# Patient Record
Sex: Female | Born: 1995 | Race: Black or African American | Hispanic: No | Marital: Single | Smoking: Never smoker
Health system: Southern US, Community
[De-identification: ages and names within clinical notes are randomized; demographics above are authoritative.]

## PROBLEM LIST (undated history)

## (undated) ENCOUNTER — Inpatient Hospital Stay (HOSPITAL_COMMUNITY): Payer: Self-pay

## (undated) DIAGNOSIS — I10 Essential (primary) hypertension: Secondary | ICD-10-CM

## (undated) DIAGNOSIS — E669 Obesity, unspecified: Secondary | ICD-10-CM

## (undated) DIAGNOSIS — F32A Depression, unspecified: Secondary | ICD-10-CM

## (undated) DIAGNOSIS — Z8742 Personal history of other diseases of the female genital tract: Secondary | ICD-10-CM

## (undated) DIAGNOSIS — F329 Major depressive disorder, single episode, unspecified: Secondary | ICD-10-CM

## (undated) DIAGNOSIS — N10 Acute tubulo-interstitial nephritis: Secondary | ICD-10-CM

## (undated) DIAGNOSIS — F419 Anxiety disorder, unspecified: Secondary | ICD-10-CM

## (undated) DIAGNOSIS — R102 Pelvic and perineal pain: Secondary | ICD-10-CM

## (undated) DIAGNOSIS — O1490 Unspecified pre-eclampsia, unspecified trimester: Secondary | ICD-10-CM

## (undated) DIAGNOSIS — D573 Sickle-cell trait: Secondary | ICD-10-CM

## (undated) DIAGNOSIS — A6000 Herpesviral infection of urogenital system, unspecified: Secondary | ICD-10-CM

## (undated) DIAGNOSIS — N159 Renal tubulo-interstitial disease, unspecified: Secondary | ICD-10-CM

## (undated) DIAGNOSIS — A64 Unspecified sexually transmitted disease: Secondary | ICD-10-CM

## (undated) DIAGNOSIS — D649 Anemia, unspecified: Secondary | ICD-10-CM

## (undated) DIAGNOSIS — F8081 Childhood onset fluency disorder: Secondary | ICD-10-CM

## (undated) HISTORY — DX: Major depressive disorder, single episode, unspecified: F32.9

## (undated) HISTORY — DX: Acute pyelonephritis: N10

## (undated) HISTORY — DX: Depression, unspecified: F32.A

## (undated) HISTORY — DX: Essential (primary) hypertension: I10

## (undated) HISTORY — DX: Anxiety disorder, unspecified: F41.9

## (undated) HISTORY — DX: Herpesviral infection of urogenital system, unspecified: A60.00

## (undated) HISTORY — DX: Obesity, unspecified: E66.9

## (undated) HISTORY — DX: Pelvic and perineal pain: R10.2

## (undated) HISTORY — DX: Personal history of other diseases of the female genital tract: Z87.42

## (undated) HISTORY — DX: Anemia, unspecified: D64.9

## (undated) HISTORY — PX: OTHER SURGICAL HISTORY: SHX169

## (undated) HISTORY — DX: Sickle-cell trait: D57.3

---

## 2015-08-04 ENCOUNTER — Emergency Department (HOSPITAL_COMMUNITY)
Admission: EM | Admit: 2015-08-04 | Discharge: 2015-08-05 | Disposition: A | Discharge status: Home / Self Care | Payer: Medicaid Other | Attending: Emergency Medicine | Admitting: Emergency Medicine

## 2015-08-04 ENCOUNTER — Encounter (HOSPITAL_COMMUNITY): Payer: Self-pay | Admitting: Family Medicine

## 2015-08-04 DIAGNOSIS — N898 Other specified noninflammatory disorders of vagina: Secondary | ICD-10-CM | POA: Diagnosis present

## 2015-08-04 DIAGNOSIS — Z8619 Personal history of other infectious and parasitic diseases: Secondary | ICD-10-CM | POA: Diagnosis not present

## 2015-08-04 DIAGNOSIS — Z3202 Encounter for pregnancy test, result negative: Secondary | ICD-10-CM | POA: Diagnosis not present

## 2015-08-04 DIAGNOSIS — Z0000 Encounter for general adult medical examination without abnormal findings: Secondary | ICD-10-CM

## 2015-08-04 DIAGNOSIS — Z711 Person with feared health complaint in whom no diagnosis is made: Secondary | ICD-10-CM | POA: Diagnosis not present

## 2015-08-04 HISTORY — DX: Unspecified pre-eclampsia, unspecified trimester: O14.90

## 2015-08-04 HISTORY — DX: Unspecified sexually transmitted disease: A64

## 2015-08-04 NOTE — ED Provider Notes (Signed)
CSN: [id number]   Arrival date & time 08/04/15 2108  History  By signing my name below, I, Connie Shea, attest that this documentation has been prepared under the direction and in the presence of Gaelle Adriance, MD. Electronically Signed: Bethel BornBritney Shea, ED Scribe. 08/05/2015. 12:04 AM.  Chief Complaint  Patient presents with  . Vaginal Discharge    HPI Patient is a 19 y.o. female presenting with vaginal discharge. The history is provided by the patient. No language interpreter was used.  Vaginal Discharge Quality:  White, yellow and thick Duration:  1 month Timing:  Constant Progression:  Unchanged Chronicity:  New Context: after intercourse   Relieved by:  Nothing Worsened by:  Nothing tried Ineffective treatments:  None tried Associated symptoms: no abdominal pain, no fever, no nausea and no vomiting   Risk factors: STI exposure and unprotected sex    Connie Shea is a 19 y.o. female who presents to the Emergency Department complaining of vaginal discharge with onset earlier in the month. The discharge is thick and white/yellow. She notes that the discharge has recently changed in odor. She was treated  [place] [facility] for gonorrhea and has had additional sexual contact with the untreated partner that she suspects exposed her initially.  Pt denies vaginal bleeding and abdominal pain. LNMP was in August but she notes being chronically irregular secondary to her method of birth control.   Past Medical History  Diagnosis Date  . Sexually transmitted disease   . Pre-eclampsia     Past Surgical History  Procedure Laterality Date  . Cesarean section      History reviewed. No pertinent family history.  Social History  Substance Use Topics  . Smoking status: Never Smoker   . Smokeless tobacco: None  . Alcohol Use: No     Review of Systems  Constitutional: Negative for fever and chills.  Gastrointestinal: Negative for nausea, vomiting and abdominal pain.   Genitourinary: Positive for vaginal discharge.  Neurological: Negative for weakness.  All other systems reviewed and are negative.  Home Medications   Prior to Admission medications   Medication Sig Start Date End Date Taking? Authorizing Provider  etonogestrel (IMPLANON) 68 MG IMPL implant 1 each by Subdermal route once.    Historical Provider, MD    Allergies  Review of patient's allergies indicates no known allergies.  Triage Vitals: BP 147/93 mmHg  Pulse 99  Temp(Src) 98.7 F (37.1 C) (Oral)  Resp 18  Ht 5\' 4"  (1.626 m)  Wt 215 lb (97.523 kg)  BMI 36.89 kg/m2  SpO2 99%  LMP   Physical Exam  Constitutional: She is oriented to person, place, and time. She appears well-developed and well-nourished. No distress.  HENT:  Head: Normocephalic.  Mouth/Throat: Oropharynx is clear and moist.  Moist mucous membranes No exudate  Eyes: EOM are normal. Pupils are equal, round, and reactive to light.  Neck: Normal range of motion. Neck supple.  Trachea midline  Cardiovascular: Normal rate and regular rhythm.   Pulmonary/Chest: Effort normal and breath sounds normal. She has no wheezes. She has no rales.  CTAB.   Abdominal: Soft. Bowel sounds are normal. She exhibits no mass. There is no tenderness. There is no rebound and no guarding.  Genitourinary: Cervix exhibits no motion tenderness and no friability. Right adnexum displays no tenderness. Left adnexum displays no tenderness. Vaginal discharge found.  Chaperone present Scant white discharge No adnexal tenderness No CMT  Musculoskeletal: Normal range of motion.  Neurological: She is alert and  oriented to person, place, and time.  Skin: Skin is warm and dry. No rash noted.  No lesions  Psychiatric: She has a normal mood and affect. Her behavior is normal.  Nursing note and vitals reviewed.   ED Course  Procedures   DIAGNOSTIC STUDIES: Oxygen Saturation is 99% on RA, normal by my interpretation.    COORDINATION OF  CARE: 12:02 AM Discussed treatment plan which includes lab work and pelvic exam with pt at bedside and pt agreed to plan.  Labs Reviewed  WET PREP, GENITAL  POC URINE PREG, ED  GC/CHLAMYDIA PROBE AMP (Connie Shea) NOT AT [facility]    Imaging Review No results found.  I personally reviewed and evaluated these  lab results as a part of my medical decision-making.  MDM   Final diagnoses:  None    Scant white discharge, likely physiologic.  GC and chlamydia sent  I personally performed the services described in this documentation, which was scribed in my presence. The recorded information has been reviewed and is accurate.      Connie Bowker, MD 08/05/15 0100

## 2015-08-04 NOTE — ED Notes (Signed)
Pt is complaining of vaginal discharge without abd pain. Pt describes the discharge has white/yellow, thick, for about one month.

## 2015-08-05 ENCOUNTER — Encounter (HOSPITAL_COMMUNITY): Payer: Self-pay | Admitting: Emergency Medicine

## 2015-08-05 LAB — WET PREP, GENITAL
TRICH WET PREP: NONE SEEN
YEAST WET PREP: NONE SEEN

## 2015-08-05 LAB — POC URINE PREG, ED: PREG TEST UR: NEGATIVE

## 2015-08-05 LAB — GC/CHLAMYDIA PROBE AMP (~~LOC~~) NOT AT ARMC
CHLAMYDIA, DNA PROBE: POSITIVE — AB
Neisseria Gonorrhea: POSITIVE — AB

## 2015-08-06 ENCOUNTER — Telehealth (HOSPITAL_COMMUNITY): Payer: Self-pay

## 2015-08-06 NOTE — Telephone Encounter (Signed)
Positive for gonorrhea and chlamydia. Chart sent to EDP office for review.

## 2016-05-07 ENCOUNTER — Emergency Department (HOSPITAL_COMMUNITY): Payer: Self-pay

## 2016-05-07 ENCOUNTER — Emergency Department (HOSPITAL_COMMUNITY)
Admission: EM | Admit: 2016-05-07 | Discharge: 2016-05-07 | Disposition: A | Discharge status: Home / Self Care | Payer: Self-pay | Attending: Emergency Medicine | Admitting: Emergency Medicine

## 2016-05-07 ENCOUNTER — Encounter (HOSPITAL_COMMUNITY): Payer: Self-pay

## 2016-05-07 DIAGNOSIS — J9801 Acute bronchospasm: Secondary | ICD-10-CM | POA: Insufficient documentation

## 2016-05-07 DIAGNOSIS — J069 Acute upper respiratory infection, unspecified: Secondary | ICD-10-CM | POA: Insufficient documentation

## 2016-05-07 MED ORDER — AEROCHAMBER PLUS FLO-VU SMALL MISC
1.0000 | Freq: Once | Status: DC
  Filled 2016-05-07: qty 1

## 2016-05-07 MED ORDER — ALBUTEROL SULFATE HFA 108 (90 BASE) MCG/ACT IN AERS: 2.0000 | INHALATION_SPRAY | Freq: Once | RESPIRATORY_TRACT | Status: DC

## 2016-05-07 MED ORDER — DEXTROMETHORPHAN-GUAIFENESIN 15-400 MG PO TABS: 1.0000 | ORAL_TABLET | ORAL | 0 refills | Status: DC

## 2016-05-07 MED ORDER — PREDNISONE 20 MG PO TABS: 40.0000 mg | ORAL_TABLET | Freq: Every day | ORAL | 0 refills | Status: DC

## 2016-05-07 MED ORDER — AEROCHAMBER PLUS W/MASK MISC
1.0000 | Freq: Once | Status: DC
  Filled 2016-05-07: qty 1

## 2016-05-07 NOTE — ED Triage Notes (Signed)
She c/o uri sx and just "not feeling well" x 1 1 /2 weeks.

## 2016-05-07 NOTE — ED Provider Notes (Signed)
[facility]-EMERGENCY DEPT Provider Note   CSN: [id number] Arrival date & time: 05/07/16  1006  First Provider Contact:  First MD Initiated Contact with Patient 05/07/16 1119        History   Chief Complaint Chief Complaint  Patient presents with  . URI    HPI Connie Shea is a 20 y.o. female he presents with chief complaint of cough. She states that her symptoms began about a week and a half ago. She has associated nasal congestion. She complains that her cough has been keeping her up at night. She's been taking OTC medications without relief. She has a history of previous reactive airway disease with her URI symptoms. She has used nebulizer treatments previously. She denies wheezing. She has been feeling more fatigued. She denies other symptoms at this time.  HPI  Past Medical History:  Diagnosis Date  . Pre-eclampsia   . Sexually transmitted disease     There are no active problems to display for this patient.   Past Surgical History:  Procedure Laterality Date  . CESAREAN SECTION      OB History    No data available       Home Medications    Prior to Admission medications   Medication Sig Start Date End Date Taking? Authorizing Provider  etonogestrel (IMPLANON) 68 MG IMPL implant 1 each by Subdermal route once.    Historical Provider, MD    Family History No family history on file.  Social History Social History  Substance Use Topics  . Smoking status: Never Smoker  . Smokeless tobacco: Not on file  . Alcohol use No     Allergies   Review of patient's allergies indicates no known allergies.   Review of Systems Review of Systems  Ten systems reviewed and are negative for acute change, except as noted in the HPI.   Physical Exam Updated Vital Signs BP 142/90 (BP Location: Left Arm)   Pulse 90   Temp 98.5 F (36.9 C) (Oral)   Resp 16   LMP 05/07/2016   SpO2 98%   Physical Exam  Constitutional: She is oriented to person, place, and time. She  appears well-developed and well-nourished. No distress.  HENT:  Head: Normocephalic and atraumatic.  Eyes: Conjunctivae are normal. No scleral icterus.  Neck: Normal range of motion.  Cardiovascular: Normal rate, regular rhythm and normal heart sounds.  Exam reveals no gallop and no friction rub.   No murmur heard. Pulmonary/Chest: Effort normal and breath sounds normal. No respiratory distress.  Abdominal: Soft. Bowel sounds are normal. She exhibits no distension and no mass. There is no tenderness. There is no guarding.  Neurological: She is alert and oriented to person, place, and time.  Skin: Skin is warm and dry. She is not diaphoretic.  Nursing note and vitals reviewed.     ED Treatments / Results  Labs (all labs ordered are listed, but only abnormal results are displayed) Labs Reviewed - No data to display  EKG  EKG Interpretation None       Radiology Dg Chest 2 View  Result Date: 05/07/2016 CLINICAL DATA:  Congestion and cough. EXAM: CHEST  2 VIEW COMPARISON:  None. FINDINGS: The heart size and mediastinal contours are within normal limits. Both lungs are clear. The visualized skeletal structures are unremarkable. IMPRESSION: No active cardiopulmonary disease. Electronically Signed   By: Gerome Sam III M.D   On: 05/07/2016 11:41   Procedures Procedures (including critical care time)  Medications Ordered in ED  Medications - No data to display   Initial Impression / Assessment and Plan / ED Course  I have reviewed the triage vital signs and the nursing notes.  Pertinent labs & imaging results that were available during my care of the patient were reviewed by me and considered in my medical decision making (see chart for details).  Clinical Course    Pt CXR negative for acute infiltrate. Patients symptoms are consistent with URI, likely viral etiology. Discussed that antibiotics are not indicated for viral infections. Pt will be discharged with symptomatic  treatment.  Verbalizes understanding and is agreeable with plan. Pt is hemodynamically stable & in NAD prior to dc.   Final Clinical Impressions(s) / ED Diagnoses   Final diagnoses:  URI (upper respiratory infection)  Cough due to bronchospasm    New Prescriptions New Prescriptions   No medications on file     Arthor Captain, PA-C 05/07/16 1222    Shaune Pollack, MD 05/07/16 1818

## 2016-05-07 NOTE — ED Notes (Signed)
Patient reports she has had a cough for several weeks, sometimes productive.  Denies fever, sore throat, nausea, or vomiting.  Reports she sometimes uses her sister's nebulizer at home and their medicine but has not been prescribed anything.  History of bronchitis.

## 2016-06-17 ENCOUNTER — Emergency Department (HOSPITAL_COMMUNITY)
Admission: EM | Admit: 2016-06-17 | Discharge: 2016-06-17 | Disposition: A | Discharge status: Home / Self Care | Payer: Medicaid Other | Attending: Emergency Medicine | Admitting: Emergency Medicine

## 2016-06-17 ENCOUNTER — Encounter (HOSPITAL_COMMUNITY): Payer: Self-pay | Admitting: Emergency Medicine

## 2016-06-17 DIAGNOSIS — Z79899 Other long term (current) drug therapy: Secondary | ICD-10-CM | POA: Insufficient documentation

## 2016-06-17 DIAGNOSIS — J029 Acute pharyngitis, unspecified: Secondary | ICD-10-CM | POA: Insufficient documentation

## 2016-06-17 DIAGNOSIS — J358 Other chronic diseases of tonsils and adenoids: Secondary | ICD-10-CM | POA: Insufficient documentation

## 2016-06-17 LAB — RAPID STREP SCREEN (MED CTR MEBANE ONLY): STREPTOCOCCUS, GROUP A SCREEN (DIRECT): NEGATIVE

## 2016-06-17 NOTE — ED Provider Notes (Signed)
[facility]-EMERGENCY DEPT Provider Note   CSN: [id number] Arrival date & time: 06/17/16  1354  By signing my name below, I, Nelwyn SalisburyJoshua Fowler, attest that this documentation has been prepared under the direction and in the presence of non-physician practitioner, Harolyn RutherfordShawn Thressa Shiffer, PA-C. Electronically Signed: Nelwyn SalisburyJoshua Fowler, Scribe. 06/17/2016. 2:06 PM.  History   Chief Complaint Chief Complaint  Patient presents with  . Sore Throat   The history is provided by the patient. No language interpreter was used.     HPI Comments:  Connie Shea is a 20 y.o. female who presents to the Emergency Department complaining of sudden-onset constant, mild to moderate, bilateral sore throat over the past 2 days. Pt endorses associated swelling to the area and a nonproductive cough. She denies any recent fever/chills, N/V, difficulty swallowing or breathing, or any other complaints.      Past Medical History:  Diagnosis Date  . Pre-eclampsia   . Sexually transmitted disease     There are no active problems to display for this patient.   Past Surgical History:  Procedure Laterality Date  . CESAREAN SECTION      OB History    No data available       Home Medications    Prior to Admission medications   Medication Sig Start Date End Date Taking? Authorizing Provider  Dextromethorphan-Guaifenesin 15-400 MG TABS Take 1 tablet by mouth every 4 (four) hours. 05/07/16   Arthor CaptainAbigail Harris, PA-C  etonogestrel (IMPLANON) 68 MG IMPL implant 1 each by Subdermal route once.    Historical Provider, MD  predniSONE (DELTASONE) 20 MG tablet Take 2 tablets (40 mg total) by mouth daily. 05/07/16   Arthor CaptainAbigail Harris, PA-C    Family History No family history on file.  Social History Social History  Substance Use Topics  . Smoking status: Never Smoker  . Smokeless tobacco: Not on file  . Alcohol use No     Allergies   Review of patient's allergies indicates no known allergies.   Review of Systems Review of Systems    Constitutional: Negative for fever.  HENT: Positive for sore throat.   Respiratory: Positive for cough.   Gastrointestinal: Negative for vomiting.  All other systems reviewed and are negative.    Physical Exam Updated Vital Signs BP 101/70 (BP Location: Left Arm)   Pulse 74   Temp 98.9 F (37.2 C) (Oral)   Resp 16   LMP 06/17/2016   SpO2 98%   Physical Exam  Constitutional: She appears well-developed and well-nourished. No distress.  HENT:  Head: Normocephalic and atraumatic.  Erythema and edema to posterior pharynx. Tip of a tonsillar stone was visualized on the right tonsil. This was confirmed with expression of right tonsil.   Eyes: Conjunctivae are normal.  Neck: Neck supple.  Cardiovascular: Normal rate, regular rhythm and intact distal pulses.   Pulmonary/Chest: Effort normal and breath sounds normal. No respiratory distress.  Abdominal: Soft. There is no tenderness. There is no guarding.  Musculoskeletal: She exhibits no edema or tenderness.  Lymphadenopathy:    She has no cervical adenopathy.  Neurological: She is alert.  Skin: Skin is warm and dry. She is not diaphoretic.  Psychiatric: She has a normal mood and affect. Her behavior is normal.  Nursing note and vitals reviewed.    ED Treatments / Results  DIAGNOSTIC STUDIES:  Oxygen Saturation is 100% on RA, normal by my interpretation.    COORDINATION OF CARE:  2:21 PM Discussed treatment plan with pt at bedside and pt  agreed to plan.  Labs (all labs ordered are listed, but only abnormal results are displayed) Labs Reviewed  RAPID STREP SCREEN (NOT AT [facility])  CULTURE, GROUP A STREP [facility])    EKG  EKG Interpretation None       Radiology No results found.  Procedures FOREIGN BODY REMOVAL Date/Time: 06/17/2016 2:20 PM Performed by: Anselm Pancoast Authorized by: Harolyn Rutherford C  Consent: Verbal consent obtained. Risks and benefits: risks, benefits and alternatives were discussed Consent given by:  patient Patient understanding: patient states understanding of the procedure being performed Patient identity confirmed: verbally with patient and arm band Body area: throat  Sedation: Patient sedated: no Patient restrained: no Patient cooperative: yes Removal mechanism: Expression with a tongue depressor. Complexity: simple 3 objects recovered. Objects recovered: Tonsil stones Post-procedure assessment: foreign body removed Patient tolerance: Patient tolerated the procedure well with no immediate complications   (including critical care time)  Medications Ordered in ED Medications - No data to display   Initial Impression / Assessment and Plan / ED Course  I have reviewed the triage vital signs and the nursing notes.  Pertinent labs & imaging results that were available during my care of the patient were reviewed by me and considered in my medical decision making (see chart for details).  Clinical Course    Patient presents with a sore throat for past 2 days. No red flag symptoms. Tonsillar stones noted on exam and removed. Patient also has a history of recurrent sore throats. ENT referral.  Final Clinical Impressions(s) / ED Diagnoses   Final diagnoses:  Sore throat  Tonsil stone    New Prescriptions Discharge Medication List as of 06/17/2016  2:44 PM    I personally performed the services described in this documentation, which was scribed in my presence. The recorded information has been reviewed and is accurate.    Anselm Pancoast, PA-C 06/17/16 1648    Loren Racer, MD 06/22/16 [phone]

## 2016-06-17 NOTE — ED Notes (Signed)
Pt ambulated to room from waiting room, tolerated well. 

## 2016-06-17 NOTE — Discharge Instructions (Signed)
The strep test was negative today. This means that the sore throat is likely viral in nature. There were also stones found in her tonsils. Some of these were removed in the ED. Follow up with the ear nose and throat specialist as soon as possible regarding the tonsil stones and recurrent sore throats. Your symptoms are consistent with a viral illness. Viruses do not require antibiotics. Treatment is symptomatic care. Drink plenty of fluids and get plenty of rest. You should be drinking at least a liter of water an hour to stay hydrated. Ibuprofen, Naproxen, or Tylenol for pain or fever. Warm liquids or Chloraseptic spray may help soothe the sore throat.

## 2016-06-17 NOTE — ED Notes (Signed)
Pt comfortable with discharge and follow up instructions. Pt declines wheelchair, escorted to waiting area by this RN. Rx x0 

## 2016-06-17 NOTE — ED Triage Notes (Signed)
Pt states "i think I have strep throat" c/o sore throat x2 days.

## 2016-06-19 LAB — CULTURE, GROUP A STREP (THRC)

## 2016-07-18 ENCOUNTER — Encounter (HOSPITAL_COMMUNITY): Payer: Self-pay | Admitting: Emergency Medicine

## 2016-07-18 ENCOUNTER — Inpatient Hospital Stay (HOSPITAL_COMMUNITY)
Admission: EM | Admit: 2016-07-18 | Discharge: 2016-07-21 | DRG: 690 | Disposition: A | Discharge status: Home / Self Care | Payer: Medicaid Other | Attending: Family Medicine | Admitting: Family Medicine

## 2016-07-18 ENCOUNTER — Emergency Department (HOSPITAL_COMMUNITY): Payer: Medicaid Other

## 2016-07-18 DIAGNOSIS — R945 Abnormal results of liver function studies: Secondary | ICD-10-CM

## 2016-07-18 DIAGNOSIS — A5611 Chlamydial female pelvic inflammatory disease: Secondary | ICD-10-CM | POA: Diagnosis present

## 2016-07-18 DIAGNOSIS — R7989 Other specified abnormal findings of blood chemistry: Secondary | ICD-10-CM

## 2016-07-18 DIAGNOSIS — Z975 Presence of (intrauterine) contraceptive device: Secondary | ICD-10-CM

## 2016-07-18 DIAGNOSIS — E876 Hypokalemia: Secondary | ICD-10-CM

## 2016-07-18 DIAGNOSIS — N10 Acute tubulo-interstitial nephritis: Principal | ICD-10-CM | POA: Diagnosis present

## 2016-07-18 DIAGNOSIS — Z862 Personal history of diseases of the blood and blood-forming organs and certain disorders involving the immune mechanism: Secondary | ICD-10-CM | POA: Diagnosis present

## 2016-07-18 DIAGNOSIS — D509 Iron deficiency anemia, unspecified: Secondary | ICD-10-CM | POA: Diagnosis present

## 2016-07-18 DIAGNOSIS — E871 Hypo-osmolality and hyponatremia: Secondary | ICD-10-CM

## 2016-07-18 DIAGNOSIS — R112 Nausea with vomiting, unspecified: Secondary | ICD-10-CM | POA: Diagnosis present

## 2016-07-18 DIAGNOSIS — Z202 Contact with and (suspected) exposure to infections with a predominantly sexual mode of transmission: Secondary | ICD-10-CM | POA: Diagnosis present

## 2016-07-18 DIAGNOSIS — E861 Hypovolemia: Secondary | ICD-10-CM | POA: Diagnosis present

## 2016-07-18 DIAGNOSIS — R1030 Lower abdominal pain, unspecified: Secondary | ICD-10-CM | POA: Diagnosis present

## 2016-07-18 DIAGNOSIS — D649 Anemia, unspecified: Secondary | ICD-10-CM

## 2016-07-18 DIAGNOSIS — R740 Nonspecific elevation of levels of transaminase and lactic acid dehydrogenase [LDH]: Secondary | ICD-10-CM | POA: Diagnosis present

## 2016-07-18 DIAGNOSIS — N12 Tubulo-interstitial nephritis, not specified as acute or chronic: Secondary | ICD-10-CM | POA: Diagnosis present

## 2016-07-18 DIAGNOSIS — N941 Unspecified dyspareunia: Secondary | ICD-10-CM | POA: Diagnosis present

## 2016-07-18 LAB — CBC
HCT: 32.9 % — ABNORMAL LOW (ref 36.0–46.0)
HEMOGLOBIN: 11 g/dL — AB (ref 12.0–15.0)
MCH: 27.6 pg (ref 26.0–34.0)
MCHC: 33.4 g/dL (ref 30.0–36.0)
MCV: 82.7 fL (ref 78.0–100.0)
Platelets: 378 10*3/uL (ref 150–400)
RBC: 3.98 MIL/uL (ref 3.87–5.11)
RDW: 13.9 % (ref 11.5–15.5)
WBC: 22.5 10*3/uL — AB (ref 4.0–10.5)

## 2016-07-18 LAB — COMPREHENSIVE METABOLIC PANEL
ALK PHOS: 114 U/L (ref 38–126)
ALT: 55 U/L — ABNORMAL HIGH (ref 14–54)
ANION GAP: 9 (ref 5–15)
AST: 43 U/L — ABNORMAL HIGH (ref 15–41)
Albumin: 3.3 g/dL — ABNORMAL LOW (ref 3.5–5.0)
BILIRUBIN TOTAL: 1.3 mg/dL — AB (ref 0.3–1.2)
BUN: 7 mg/dL (ref 6–20)
CALCIUM: 8.3 mg/dL — AB (ref 8.9–10.3)
CO2: 25 mmol/L (ref 22–32)
Chloride: 99 mmol/L — ABNORMAL LOW (ref 101–111)
Creatinine, Ser: 0.73 mg/dL (ref 0.44–1.00)
Glucose, Bld: 100 mg/dL — ABNORMAL HIGH (ref 65–99)
Potassium: 3.3 mmol/L — ABNORMAL LOW (ref 3.5–5.1)
Sodium: 133 mmol/L — ABNORMAL LOW (ref 135–145)
TOTAL PROTEIN: 7.6 g/dL (ref 6.5–8.1)

## 2016-07-18 LAB — DIFFERENTIAL
BASOS PCT: 0 %
Basophils Absolute: 0 10*3/uL (ref 0.0–0.1)
EOS PCT: 0 %
Eosinophils Absolute: 0 10*3/uL (ref 0.0–0.7)
LYMPHS ABS: 1.4 10*3/uL (ref 0.7–4.0)
Lymphocytes Relative: 6 %
MONO ABS: 2.7 10*3/uL — AB (ref 0.1–1.0)
MONOS PCT: 12 %
NEUTROS ABS: 18.4 10*3/uL — AB (ref 1.7–7.7)
Neutrophils Relative %: 82 %

## 2016-07-18 LAB — URINE MICROSCOPIC-ADD ON

## 2016-07-18 LAB — I-STAT CG4 LACTIC ACID, ED: LACTIC ACID, VENOUS: 1.33 mmol/L (ref 0.5–1.9)

## 2016-07-18 LAB — URINALYSIS, ROUTINE W REFLEX MICROSCOPIC
BILIRUBIN URINE: NEGATIVE
Glucose, UA: NEGATIVE mg/dL
Ketones, ur: NEGATIVE mg/dL
NITRITE: NEGATIVE
Protein, ur: NEGATIVE mg/dL
pH: 6 (ref 5.0–8.0)

## 2016-07-18 LAB — BILIRUBIN, FRACTIONATED(TOT/DIR/INDIR)
BILIRUBIN DIRECT: 0.7 mg/dL — AB (ref 0.1–0.5)
BILIRUBIN INDIRECT: 0.8 mg/dL (ref 0.3–0.9)
BILIRUBIN TOTAL: 1.5 mg/dL — AB (ref 0.3–1.2)

## 2016-07-18 LAB — LIPASE, BLOOD: Lipase: 20 U/L (ref 11–51)

## 2016-07-18 LAB — I-STAT BETA HCG BLOOD, ED (MC, WL, AP ONLY)

## 2016-07-18 MED ORDER — SODIUM CHLORIDE 0.9 % IV BOLUS (SEPSIS)
2000.0000 mL | Freq: Once | INTRAVENOUS | Status: AC
  Administered 2016-07-18: 1000 mL via INTRAVENOUS

## 2016-07-18 MED ORDER — AZITHROMYCIN 1 G PO PACK
1.0000 g | PACK | Freq: Once | ORAL | Status: AC
  Administered 2016-07-18: 1 g via ORAL
  Filled 2016-07-18: qty 1

## 2016-07-18 MED ORDER — ACETAMINOPHEN 325 MG PO TABS: 650.0000 mg | ORAL_TABLET | Freq: Once | ORAL | Status: DC

## 2016-07-18 MED ORDER — POTASSIUM CHLORIDE IN NACL 20-0.9 MEQ/L-% IV SOLN
INTRAVENOUS | Status: DC
  Administered 2016-07-18 – 2016-07-21 (×5): via INTRAVENOUS
  Filled 2016-07-18 (×5): qty 1000

## 2016-07-18 MED ORDER — IOPAMIDOL (ISOVUE-300) INJECTION 61%
100.0000 mL | Freq: Once | INTRAVENOUS | Status: AC | PRN
  Administered 2016-07-18: 100 mL via INTRAVENOUS

## 2016-07-18 MED ORDER — DEXTROSE 5 % IV SOLN
1.0000 g | INTRAVENOUS | Status: DC
  Administered 2016-07-19 – 2016-07-20 (×2): 1 g via INTRAVENOUS
  Filled 2016-07-18 (×2): qty 10

## 2016-07-18 MED ORDER — ENOXAPARIN SODIUM 40 MG/0.4ML ~~LOC~~ SOLN
40.0000 mg | SUBCUTANEOUS | Status: DC
  Administered 2016-07-18 – 2016-07-20 (×3): 40 mg via SUBCUTANEOUS
  Filled 2016-07-18 (×3): qty 0.4

## 2016-07-18 MED ORDER — ONDANSETRON HCL 4 MG PO TABS: 4.0000 mg | ORAL_TABLET | Freq: Four times a day (QID) | ORAL | Status: DC | PRN

## 2016-07-18 MED ORDER — HYDROMORPHONE HCL 1 MG/ML IJ SOLN
1.0000 mg | Freq: Once | INTRAMUSCULAR | Status: AC
  Administered 2016-07-18: 1 mg via INTRAVENOUS
  Filled 2016-07-18: qty 1

## 2016-07-18 MED ORDER — ACETAMINOPHEN 650 MG RE SUPP: 650.0000 mg | Freq: Four times a day (QID) | RECTAL | Status: DC | PRN

## 2016-07-18 MED ORDER — ACETAMINOPHEN 325 MG PO TABS
650.0000 mg | ORAL_TABLET | Freq: Once | ORAL | Status: AC | PRN
  Administered 2016-07-18: 650 mg via ORAL
  Filled 2016-07-18: qty 2

## 2016-07-18 MED ORDER — SODIUM CHLORIDE 0.9 % IV BOLUS (SEPSIS)
1000.0000 mL | Freq: Once | INTRAVENOUS | Status: AC
  Administered 2016-07-18: 1000 mL via INTRAVENOUS

## 2016-07-18 MED ORDER — DEXTROSE 5 % IV SOLN
1.0000 g | Freq: Once | INTRAVENOUS | Status: AC
  Administered 2016-07-18: 1 g via INTRAVENOUS
  Filled 2016-07-18: qty 10

## 2016-07-18 MED ORDER — ACETAMINOPHEN 325 MG PO TABS: 650.0000 mg | ORAL_TABLET | Freq: Four times a day (QID) | ORAL | Status: DC | PRN

## 2016-07-18 MED ORDER — ONDANSETRON HCL 4 MG/2ML IJ SOLN
4.0000 mg | Freq: Once | INTRAMUSCULAR | Status: AC | PRN
  Administered 2016-07-18: 4 mg via INTRAVENOUS
  Filled 2016-07-18: qty 2

## 2016-07-18 MED ORDER — ONDANSETRON HCL 4 MG/2ML IJ SOLN: 4.0000 mg | Freq: Once | INTRAMUSCULAR | Status: DC

## 2016-07-18 MED ORDER — IBUPROFEN 200 MG PO TABS
400.0000 mg | ORAL_TABLET | Freq: Four times a day (QID) | ORAL | Status: DC | PRN
  Administered 2016-07-18: 400 mg via ORAL
  Filled 2016-07-18: qty 2

## 2016-07-18 MED ORDER — ONDANSETRON HCL 4 MG/2ML IJ SOLN: 4.0000 mg | Freq: Four times a day (QID) | INTRAMUSCULAR | Status: DC | PRN

## 2016-07-18 MED ORDER — HYDROCODONE-ACETAMINOPHEN 5-325 MG PO TABS
1.0000 | ORAL_TABLET | Freq: Four times a day (QID) | ORAL | Status: DC | PRN
  Administered 2016-07-18 – 2016-07-20 (×6): 1 via ORAL
  Filled 2016-07-18 (×6): qty 1

## 2016-07-18 NOTE — Progress Notes (Signed)
Pharmacy Antibiotic Note  Connie Shea is a 20 y.o. female admitted on 07/18/2016 with UTI.  Pharmacy has been consulted for Ceftriaxone dosing.  NKDA.    Plan: Ceftriaxone 1g IV q24h  Dosage remains stable and need for further dosage adjustment appears unlikely at present.   Will sign off at this time.  Please reconsult if a change in clinical status warrants re-evaluation of dosage. Thank you for allowing pharmacy to be a part of this patient's care.  Haynes Hoehnolleen Hebert Dooling, PharmD, BCPS 07/18/2016, 9:21 PM  Pager: [phone]   Height: 5\' 5"  (165.1 cm) IBW/kg (Calculated) : 57  Temp (24hrs), Avg:100.4 F (38 C), Min:98.5 F (36.9 C), Max:103.2 F (39.6 C)   Recent Labs Lab 07/18/16 1454 07/18/16 2037  WBC 22.5*  --   CREATININE 0.73  --   LATICACIDVEN  --  1.33    CrCl cannot be calculated (Unknown ideal weight.).    No Known Allergies  Antimicrobials this admission: 10/9 Ceftriaxone >>   Dose adjustments this admission:  Microbiology results: 10/9 BCx: Collected 10/9 UCx: Collected

## 2016-07-18 NOTE — ED Triage Notes (Signed)
Patient states that seen was seen and told had flu virus last week and still not feeling any better. C/o body aches, vomiting, fevers. Patient taking toradol for pain

## 2016-07-18 NOTE — ED Provider Notes (Signed)
[facility]-EMERGENCY DEPT Provider Note   CSN: [id number] Arrival date & time: 07/18/16  1409     History   Chief Complaint Chief Complaint  Patient presents with  . Generalized Body Aches  . Emesis  . Fever  . Abdominal Pain    HPI Millisa Salguero is a 20 y.o. female.  Patient complains of lower abdominal pain with vomiting fevers and chills   The history is provided by the patient. No language interpreter was used.  Emesis   The current episode started more than 2 days ago. The problem occurs 2 to 4 times per day. The problem has not changed since onset.The emesis has an appearance of stomach contents. The maximum temperature recorded prior to her arrival was 100 to 100.9 F. Associated symptoms include abdominal pain and chills. Pertinent negatives include no cough, no diarrhea and no headaches. Risk factors include suspect food intake.    Past Medical History:  Diagnosis Date  . Pre-eclampsia   . Sexually transmitted disease     Patient Active Problem List   Diagnosis Date Noted  . Pyelonephritis 07/18/2016  . Hyponatremia 07/18/2016  . Elevated LFTs 07/18/2016  . Normocytic anemia 07/18/2016  . Hypokalemia 07/18/2016    Past Surgical History:  Procedure Laterality Date  . CESAREAN SECTION      OB History    No data available       Home Medications    Prior to Admission medications   Medication Sig Start Date End Date Taking? Authorizing Provider  ACETAMINOPHEN PO Take 1 tablet by mouth every 6 (six) hours as needed. Fever, pain.   Yes Historical Provider, MD  etonogestrel (IMPLANON) 68 MG IMPL implant 1 each by Subdermal route once.   Yes Historical Provider, MD  ibuprofen (ADVIL,MOTRIN) 200 MG tablet Take 200 mg by mouth every 6 (six) hours as needed for fever, mild pain or moderate pain.   Yes Historical Provider, MD  ketorolac (TORADOL) 10 MG tablet Take 10 mg by mouth every 6 (six) hours as needed for pain. 07/15/16  Yes Historical Provider, MD    Dextromethorphan-Guaifenesin 15-400 MG TABS Take 1 tablet by mouth every 4 (four) hours. Patient not taking: Reported on 07/18/2016 05/07/16   Arthor CaptainAbigail Harris, PA-C  predniSONE (DELTASONE) 20 MG tablet Take 2 tablets (40 mg total) by mouth daily. Patient not taking: Reported on 07/18/2016 05/07/16   Arthor CaptainAbigail Harris, PA-C    Family History Family History  Problem Relation Age of Onset  . Hypertension Mother   . Asthma Mother   . Hypertension Maternal Aunt     Social History Social History  Substance Use Topics  . Smoking status: Never Smoker  . Smokeless tobacco: Never Used  . Alcohol use No     Allergies   Review of patient's allergies indicates no known allergies.   Review of Systems Review of Systems  Constitutional: Positive for chills. Negative for appetite change and fatigue.  HENT: Negative for congestion, ear discharge and sinus pressure.   Eyes: Negative for discharge.  Respiratory: Negative for cough.   Cardiovascular: Negative for chest pain.  Gastrointestinal: Positive for abdominal pain. Negative for diarrhea.  Genitourinary: Negative for frequency and hematuria.  Musculoskeletal: Negative for back pain.  Skin: Negative for rash.  Neurological: Negative for seizures and headaches.  Psychiatric/Behavioral: Negative for hallucinations.     Physical Exam Updated Vital Signs BP 106/57 (BP Location: Left Arm)   Pulse 98   Temp 98.5 F (36.9 C) (Oral)   Resp 17  Ht 5\' 5"  (1.651 m)   LMP 07/04/2016   SpO2 100%   Physical Exam  Constitutional: She is oriented to person, place, and time. She appears well-developed.  HENT:  Head: Normocephalic.  Eyes: Conjunctivae and EOM are normal. No scleral icterus.  Neck: Neck supple. No thyromegaly present.  Cardiovascular: Normal rate and regular rhythm.  Exam reveals no gallop and no friction rub.   No murmur heard. Pulmonary/Chest: No stridor. She has no wheezes. She has no rales. She exhibits no tenderness.   Abdominal: She exhibits no distension. There is tenderness. There is no rebound.  Tender suprapubic  Musculoskeletal: Normal range of motion. She exhibits no edema.  Lymphadenopathy:    She has no cervical adenopathy.  Neurological: She is oriented to person, place, and time. She exhibits normal muscle tone. Coordination normal.  Skin: No rash noted. No erythema.  Psychiatric: She has a normal mood and affect. Her behavior is normal.     ED Treatments / Results  Labs (all labs ordered are listed, but only abnormal results are displayed) Labs Reviewed  COMPREHENSIVE METABOLIC PANEL - Abnormal; Notable for the following:       Result Value   Sodium 133 (*)    Potassium 3.3 (*)    Chloride 99 (*)    Glucose, Bld 100 (*)    Calcium 8.3 (*)    Albumin 3.3 (*)    AST 43 (*)    ALT 55 (*)    Total Bilirubin 1.3 (*)    All other components within normal limits  CBC - Abnormal; Notable for the following:    WBC 22.5 (*)    Hemoglobin 11.0 (*)    HCT 32.9 (*)    All other components within normal limits  URINALYSIS, ROUTINE W REFLEX MICROSCOPIC (NOT AT [facility]) - Abnormal; Notable for the following:    Specific Gravity, Urine >1.046 (*)    Hgb urine dipstick MODERATE (*)    Leukocytes, UA MODERATE (*)    All other components within normal limits  URINE MICROSCOPIC-ADD ON - Abnormal; Notable for the following:    Squamous Epithelial / LPF 0-5 (*)    Bacteria, UA RARE (*)    All other components within normal limits  CULTURE, BLOOD (ROUTINE X 2)  CULTURE, BLOOD (ROUTINE X 2)  URINE CULTURE  LIPASE, BLOOD  CBC WITH DIFFERENTIAL/PLATELET  I-STAT BETA HCG BLOOD, ED ([facility], [facility], [facility] ONLY)  I-STAT CG4 LACTIC ACID, ED    EKG  EKG Interpretation None       Radiology Dg Chest 2 View  Result Date: 07/18/2016 CLINICAL DATA:  Fever, cough. EXAM: CHEST  2 VIEW COMPARISON:  Radiographs of May 07, 2016. FINDINGS: The heart size and mediastinal contours are within normal limits. Both  lungs are clear. No pneumothorax or pleural effusion is noted. The visualized skeletal structures are unremarkable. IMPRESSION: No active cardiopulmonary disease. Electronically Signed   By: Lupita Raider, M.D.   On: 07/18/2016 16:12   Ct Abdomen Pelvis W Contrast  Result Date: 07/18/2016 CLINICAL DATA:  Fever and abdominal pain and body aches. EXAM: CT ABDOMEN AND PELVIS WITH CONTRAST TECHNIQUE: Multidetector CT imaging of the abdomen and pelvis was performed using the standard protocol following bolus administration of intravenous contrast. CONTRAST:  ISOVUE-300 IOPAMIDOL (ISOVUE-300) INJECTION 61% COMPARISON:  None. FINDINGS: Lower chest: Slight bibasilar atelectasis posteriorly. No effusions. Heart size is normal. Hepatobiliary: Slight hepatomegaly with hepatic steatosis. Biliary tree is normal. Pancreas: Normal. Spleen: Normal. Adrenals/Urinary Tract:  There is multifocal bilateral pyelonephritis. There is a small amount of fluid in the perinephric space on the right. There is abnormal enhancement of the right ureter and right ureter slightly dilated to the pelvic inlet. There is no stone. The bladder appears normal. Adrenal glands are normal. Stomach/Bowel: Normal including the terminal ileum and appendix. Vascular/Lymphatic: Normal. Reproductive: Normal. Other: Tiny amount of free fluid in the pelvis, normal for a female of this age. No free air. Musculoskeletal: Normal. IMPRESSION: 1. Bilateral pyelonephritis, right worse than left. 2. Slight bibasilar atelectasis. 3. Slight hepatomegaly with hepatic steatosis. Electronically Signed   By: Francene Boyers M.D.   On: 07/18/2016 17:07    Procedures Procedures (including critical care time)  Medications Ordered in ED Medications  ondansetron (ZOFRAN) injection 4 mg (4 mg Intravenous Not Given 07/18/16 1502)  acetaminophen (TYLENOL) tablet 650 mg (650 mg Oral Not Given 07/18/16 1502)  ondansetron (ZOFRAN) injection 4 mg (4 mg Intravenous Given  07/18/16 1458)  acetaminophen (TYLENOL) tablet 650 mg (650 mg Oral Given 07/18/16 1458)  sodium chloride 0.9 % bolus 1,000 mL (0 mLs Intravenous Stopped 07/18/16 1609)  HYDROmorphone (DILAUDID) injection 1 mg (1 mg Intravenous Given 07/18/16 1609)  iopamidol (ISOVUE-300) 61 % injection 100 mL (100 mLs Intravenous Contrast Given 07/18/16 1636)  cefTRIAXone (ROCEPHIN) 1 g in dextrose 5 % 50 mL IVPB (0 g Intravenous Stopped 07/18/16 1801)     Initial Impression / Assessment and Plan / ED Course  I have reviewed the triage vital signs and the nursing notes.  Pertinent labs & imaging results that were available during my care of the patient were reviewed by me and considered in my medical decision making (see chart for details).  Clinical Course    Patient with pyelonephritis she will be admitted for IV antibiotics  Final Clinical Impressions(s) / ED Diagnoses   Final diagnoses:  Acute pyelonephritis    New Prescriptions New Prescriptions   No medications on file     Bethann Berkshire, MD 07/18/16 2029

## 2016-07-18 NOTE — ED Notes (Signed)
Pt used restroom but did not obtain sample. Pt aware urine sample needed.

## 2016-07-18 NOTE — ED Notes (Signed)
Bed: WA24 Expected date:  Expected time:  Means of arrival:  Comments: Tr 2 

## 2016-07-18 NOTE — ED Notes (Addendum)
BOTH CULTURES HAVE BEEN DRAWN. 1723 L hand and 1726 R AC.   Pt aware need of urine sample.

## 2016-07-18 NOTE — ED Notes (Signed)
[facility] Will administer medications as ordered with pt return.

## 2016-07-18 NOTE — ED Notes (Signed)
Pt scared of needles -  Would like to wait and have labs/IV done at same time.  Triage RN aware.

## 2016-07-18 NOTE — H&P (Signed)
History and Physical  Patient Name: Connie Shea     ZOX:[id number]    DOB: 01-12-1996    DOA: 07/18/2016 PCP: No PCP Per Patient   Patient coming from: Home  Chief Complaint: Myalgias, fever, dysuria  HPI: Connie Shea is a 20 y.o. female with no significant past medical history who presents with 1 week fever/chills, dysuria and now vomiting.  The patient was in her usual state of health until about 7 days ago when she had onset of fever and generalized body aches. She was seen in one of the [facility] regional emergency room in [place], [place] where she was diagnosed with hiatal eye, rapid flu was negative, and she was discharged with acetaminophen/ibuprofen and RPs.  Since then, she felt progressively worse, continued to have fever and myalgias and today started to vomit NBNB emesis several times and feel weak and sluggish, so she returned to the ER, his time here at [facility].    She also has had dysuria, urinary frequency, suprapubic pain, flank pain and foul-smelling urine. The flank pain is bilateral, achy and severe. She has had no cough, chest congestion, sputum, or dyspnea.  ED course: -Fever to 103.73F, heart rate 120s, blood pressure 120/70, pulse oximetry normal -Na 133, K 3.3, Cr 0.73 (baseline 0.7 in June), WBC 22.5 K, Hgb 11.0 (baseline 0.7 in June), a urine pregnancy test negative -Urine showed pyuria and hematuria -Mild transaminitis -Blood and urine cultures were obtained, she was given 1 L normal saline, and ceftriaxone was administered and TRH were asked to evaluate for admission  Of note, the patient states that she and her partner tested positive for chlamydia in the last few months, but did not get treatment because they couldn't afford it. She has had some foul-smelling vaginal discharge, and mild pain with intercourse recently.  She has a Nexplanon insert, and has spotting always, nothing different recently.   She does not use alcohol regularly, but notes having many spirits drinks  on Saturday.      ROS: Review of Systems  Constitutional: Positive for chills, fever and malaise/fatigue.  Respiratory: Negative for cough and sputum production.   Gastrointestinal: Positive for abdominal pain (RLQ). Negative for blood in stool, constipation, diarrhea, heartburn, melena, nausea and vomiting.  Genitourinary: Positive for dysuria, flank pain and frequency. Negative for hematuria.  Musculoskeletal: Positive for myalgias.  Neurological: Positive for headaches.  All other systems reviewed and are negative.         Past Medical History:  Diagnosis Date  . Pre-eclampsia   . Sexually transmitted disease     Past Surgical History:  Procedure Laterality Date  . CESAREAN SECTION      Social History: Patient lives with her child.  The patient walks unassisted.  She is from [place], [place].  She works in a Dentist.  She does not use tobacco.  Has had only one sexual partner in last 6 months.  Uses alcohol in binge pattern.  No Known Allergies  Family history: family history includes Asthma in her mother; Hypertension in her maternal aunt and mother.  Prior to Admission medications   Medication Sig Start Date End Date Taking? Authorizing Provider  etonogestrel (IMPLANON) 68 MG IMPL implant 1 each by Subdermal route once.   Yes Historical Provider, MD       Physical Exam: BP 106/57 (BP Location: Left Arm)   Pulse 98   Temp 98.5 F (36.9 C) (Oral)   Resp 17   Ht 5\' 5"  (1.651 m)  LMP 07/04/2016   SpO2 100%  General appearance: Obese adult female, alert and in mild distress from malaise, appears sluggish and keeps eyes closed.   Eyes: Anicteric, conjunctiva pink, lids and lashes normal. PERRL.    ENT: No nasal deformity, discharge, epistaxis.  Hearing normal. OP moist without lesions.   Neck: Thyroid diffusely enlarged, not tender.  Trachea midline.   Lymph: No cervical or supraclavicular lymphadenopathy. Skin: Hot and slightly diaphoretic.  No suspicious rashes or  lesions. Cardiac: Tachycardic, regular, nl S1-S2, no murmurs appreciated.  Capillary refill is brisk.  JVP normal.  No LE edema.  Radial and DP pulses 2+ and symmetric. Respiratory: Normal respiratory rate and rhythm.  CTAB without rales or wheezes. Abdomen: Abdomen soft.  Mild RLQ TTP without guarding. No ascites, distension, hepatosplenomegaly.   GU: External genitalia normal, vaginal mucosa normal.  No purulent discharge, and no discharge from cervical os.  No redness of cervix.  Cervical swab obtained for GC and chlamydia testing.  On bimanual exam, there was no CMT. MSK: No deformities or effusions.  No cyanosis or clubbing. Neuro: Cranial nerves normal.  Sensation intact to light touch. Speech is fluent.  Muscle strength normal.    Psych: Sensorium intact and responding to questions, attention normal.  Behavior appropriate.  Affect tired.  Judgment and insight appear normal.     Labs on Admission:  I have personally reviewed following labs and imaging studies: CBC:  Recent Labs Lab 07/18/16 1454  WBC 22.5*  HGB 11.0*  HCT 32.9*  MCV 82.7  PLT 378   Basic Metabolic Panel:  Recent Labs Lab 07/18/16 1454  NA 133*  K 3.3*  CL 99*  CO2 25  GLUCOSE 100*  BUN 7  CREATININE 0.73  CALCIUM 8.3*   GFR: CrCl cannot be calculated (Unknown ideal weight.).  Liver Function Tests:  Recent Labs Lab 07/18/16 1454  AST 43*  ALT 55*  ALKPHOS 114  BILITOT 1.3*  PROT 7.6  ALBUMIN 3.3*    Recent Labs Lab 07/18/16 1454  LIPASE 20   No results for input(s): AMMONIA in the last 168 hours. Coagulation Profile: No results for input(s): INR, PROTIME in the last 168 hours. Cardiac Enzymes: No results for input(s): CKTOTAL, CKMB, CKMBINDEX, TROPONINI in the last 168 hours. BNP (last 3 results) No results for input(s): PROBNP in the last 8760 hours. HbA1C: No results for input(s): HGBA1C in the last 72 hours. CBG: No results for input(s): GLUCAP in the last 168 hours. Lipid  Profile: No results for input(s): CHOL, HDL, LDLCALC, TRIG, CHOLHDL, LDLDIRECT in the last 72 hours. Thyroid Function Tests: No results for input(s): TSH, T4TOTAL, FREET4, T3FREE, THYROIDAB in the last 72 hours. Anemia Panel: No results for input(s): VITAMINB12, FOLATE, FERRITIN, TIBC, IRON, RETICCTPCT in the last 72 hours. Sepsis Labs: Lactic acid 1.33 Invalid input(s): PROCALCITONIN, LACTICIDVEN No results found for this or any previous visit (from the past 240 hour(s)).       Radiological Exams on Admission: Personally reviewed: Dg Chest 2 View  Result Date: 07/18/2016 CLINICAL DATA:  Fever, cough. EXAM: CHEST  2 VIEW COMPARISON:  Radiographs of May 07, 2016. FINDINGS: The heart size and mediastinal contours are within normal limits. Both lungs are clear. No pneumothorax or pleural effusion is noted. The visualized skeletal structures are unremarkable. IMPRESSION: No active cardiopulmonary disease. Electronically Signed   By: Lupita Raider, M.D.   On: 07/18/2016 16:12   Ct Abdomen Pelvis W Contrast  Result Date: 07/18/2016 CLINICAL DATA:  Fever and abdominal pain and body aches. EXAM: CT ABDOMEN AND PELVIS WITH CONTRAST TECHNIQUE: Multidetector CT imaging of the abdomen and pelvis was performed using the standard protocol following bolus administration of intravenous contrast. CONTRAST:  100mL ISOVUE-300 IOPAMIDOL (ISOVUE-300) INJECTION 61% COMPARISON:  None. FINDINGS: Lower chest: Slight bibasilar atelectasis posteriorly. No effusions. Heart size is normal. Hepatobiliary: Slight hepatomegaly with hepatic steatosis. Biliary tree is normal. Pancreas: Normal. Spleen: Normal. Adrenals/Urinary Tract: There is multifocal bilateral pyelonephritis. There is a small amount of fluid in the perinephric space on the right. There is abnormal enhancement of the right ureter and right ureter slightly dilated to the pelvic inlet. There is no stone. The bladder appears normal. Adrenal glands are normal.  Stomach/Bowel: Normal including the terminal ileum and appendix. Vascular/Lymphatic: Normal. Reproductive: Normal. Other: Tiny amount of free fluid in the pelvis, normal for a female of this age. No free air. Musculoskeletal: Normal. IMPRESSION: 1. Bilateral pyelonephritis, right worse than left. 2. Slight bibasilar atelectasis. 3. Slight hepatomegaly with hepatic steatosis. Electronically Signed   By: Francene BoyersJames  Maxwell M.D.   On: 07/18/2016 17:07        Assessment/Plan  1. Pyelonephritis:  Pyuria on UA, dysuria by history, bilateral renal stranding on CT without other abnormalities on abdomen/pelvis imaging.  With SIRS syndrome, without end organ failure, not septic.    Concern for PID low given normal speculum/bimanual exam. -Additional fluids now -Continue ceftriaxone IV -Follow urine culture   2. Hyponatremia:  Presumably this is hypovolemic in setting of pyelonephritis -Repeat sodium tomorrow  3. Elevated LFTs:  Steatosis noted on CT, suspect NASH vs reactive in setting of infection.  Cholangitis doubted.  Given history of GC/chlamydia, will check for HIV/hepatitis. -Repeat LFT tomorrow -Check fractionated bilirubin -Check HIV/hepatitis serologies  4. Anemia:  Normocytic to microcytic.  No history consistent with bleeding. -Check ferritin and iron  5. Hypokalemia:  Mild. -Replace in IVF overnight  6. Chlamydia: Patient reports untreated chlamydia.  -Follow GC and chlamydia NAAT -Azithromycin 1g once given for chlamydia -Ceftriaxone for UTI expected to treat possible GC     DVT prophylaxis: Lovenox  Code Status: FULL  Family Communication: Partner at bedside  Disposition Plan: Anticipate IV fluids and IV antibiotics overnight, re-evaluate tomorrow to determine need for inpatient care. Consults called: None Admission status: OBS, med surg At the point of initial evaluation, it is my clinical opinion that admission for OBSERVATION is reasonable and necessary because  although the patient is young and otherwise healthy and has normal lactate (and thus I think she may be medically stable for discharge from the hospital within 24 hours), she is currently tachycardic to 110, appears uncomfrotable and has not demonstrated ability to take anything by mouth consistently, and so I recommend she be observed in the hospital setting.    Medical decision making: Patient seen at 8:05 PM on 07/18/2016.  The patient was discussed with Dr. Estell HarpinZammit.  What exists of the patient's chart was reviewed in depth and [facility] obtained and summarized above.  Clinical condition: requiring additional fluids and tachycardic, but mentating well and BP normal.        Alberteen Samhristopher P Jerret Mcbane [facility] Pager [phone]

## 2016-07-19 DIAGNOSIS — E871 Hypo-osmolality and hyponatremia: Secondary | ICD-10-CM | POA: Diagnosis present

## 2016-07-19 DIAGNOSIS — N10 Acute tubulo-interstitial nephritis: Secondary | ICD-10-CM | POA: Diagnosis present

## 2016-07-19 DIAGNOSIS — N941 Unspecified dyspareunia: Secondary | ICD-10-CM | POA: Diagnosis present

## 2016-07-19 DIAGNOSIS — E861 Hypovolemia: Secondary | ICD-10-CM | POA: Diagnosis present

## 2016-07-19 DIAGNOSIS — D509 Iron deficiency anemia, unspecified: Secondary | ICD-10-CM | POA: Diagnosis present

## 2016-07-19 DIAGNOSIS — E876 Hypokalemia: Secondary | ICD-10-CM | POA: Diagnosis present

## 2016-07-19 DIAGNOSIS — R112 Nausea with vomiting, unspecified: Secondary | ICD-10-CM | POA: Diagnosis present

## 2016-07-19 DIAGNOSIS — A5611 Chlamydial female pelvic inflammatory disease: Secondary | ICD-10-CM | POA: Diagnosis present

## 2016-07-19 DIAGNOSIS — Z975 Presence of (intrauterine) contraceptive device: Secondary | ICD-10-CM | POA: Diagnosis not present

## 2016-07-19 DIAGNOSIS — Z202 Contact with and (suspected) exposure to infections with a predominantly sexual mode of transmission: Secondary | ICD-10-CM | POA: Diagnosis present

## 2016-07-19 DIAGNOSIS — R1030 Lower abdominal pain, unspecified: Secondary | ICD-10-CM | POA: Diagnosis present

## 2016-07-19 DIAGNOSIS — R7989 Other specified abnormal findings of blood chemistry: Secondary | ICD-10-CM | POA: Diagnosis present

## 2016-07-19 DIAGNOSIS — R740 Nonspecific elevation of levels of transaminase and lactic acid dehydrogenase [LDH]: Secondary | ICD-10-CM | POA: Diagnosis present

## 2016-07-19 DIAGNOSIS — N12 Tubulo-interstitial nephritis, not specified as acute or chronic: Secondary | ICD-10-CM

## 2016-07-19 HISTORY — DX: Acute pyelonephritis: N10

## 2016-07-19 LAB — COMPREHENSIVE METABOLIC PANEL
ALT: 40 U/L (ref 14–54)
ANION GAP: 9 (ref 5–15)
AST: 24 U/L (ref 15–41)
Albumin: 2.7 g/dL — ABNORMAL LOW (ref 3.5–5.0)
Alkaline Phosphatase: 93 U/L (ref 38–126)
BUN: <5 mg/dL — ABNORMAL LOW (ref 6–20)
CHLORIDE: 105 mmol/L (ref 101–111)
CO2: 26 mmol/L (ref 22–32)
CREATININE: 0.57 mg/dL (ref 0.44–1.00)
Calcium: 7.8 mg/dL — ABNORMAL LOW (ref 8.9–10.3)
Glucose, Bld: 107 mg/dL — ABNORMAL HIGH (ref 65–99)
Potassium: 3.6 mmol/L (ref 3.5–5.1)
SODIUM: 140 mmol/L (ref 135–145)
Total Bilirubin: 1 mg/dL (ref 0.3–1.2)
Total Protein: 6.5 g/dL (ref 6.5–8.1)

## 2016-07-19 LAB — CBC
HCT: 29.5 % — ABNORMAL LOW (ref 36.0–46.0)
HEMOGLOBIN: 9.6 g/dL — AB (ref 12.0–15.0)
MCH: 27.3 pg (ref 26.0–34.0)
MCHC: 32.5 g/dL (ref 30.0–36.0)
MCV: 83.8 fL (ref 78.0–100.0)
Platelets: 350 10*3/uL (ref 150–400)
RBC: 3.52 MIL/uL — AB (ref 3.87–5.11)
RDW: 14.2 % (ref 11.5–15.5)
WBC: 20 10*3/uL — AB (ref 4.0–10.5)

## 2016-07-19 LAB — GC/CHLAMYDIA PROBE AMP (~~LOC~~) NOT AT ARMC
CHLAMYDIA, DNA PROBE: POSITIVE — AB
Neisseria Gonorrhea: NEGATIVE

## 2016-07-19 LAB — HIV ANTIBODY (ROUTINE TESTING W REFLEX): HIV Screen 4th Generation wRfx: NONREACTIVE

## 2016-07-19 NOTE — Progress Notes (Signed)
PROGRESS NOTE  Connie Shea  ZOX:[id number] DOB: 12-Dec-1995 DOA: 07/18/2016 PCP: No PCP Per Patient   Brief Narrative: Connie Shea is a 20 y.o. female with no significant past medical history who presented with 1 week fever/chills, dysuria and now vomiting. She also has had urinary frequency, suprapubic pain, flank pain and foul-smelling urine. The flank pain is bilateral, achy and severe. On arrival, temperature was 103.61F, HR 120s, BP 120/70. WBC 22.5k, Cr 0.73, UPT neg. Urine showed hematuria and pyuria. Blood and urine cultures were obtained, she was given 1 L normal saline, and ceftriaxone was administered. TRH were asked to evaluate for admission  Of note, the patient states that she and her partner tested positive for chlamydia in the last few months, but did not get treatment. She's had foul-smelling discharge, dyspareunia, stable spotting with nexplanon. Admission exam documented pelvic as normal without CMT.   Assessment & Plan: Principal Problem:   Pyelonephritis Active Problems:   Hyponatremia   Elevated LFTs   Normocytic anemia   Hypokalemia  Pyelonephritis:  Pyuria on UA, dysuria by history, bilateral renal stranding on CT without other abnormalities on abdomen/pelvis imaging. With SIRS at admission without end-organ dysfunction. Leukocytosis stable 22.5 > 20k, lactate 1.33.  - Continue IVF's and zofran prn - Continue ceftriaxone IV - Follow urine culture  Chlamydia infection: Concern for PID low given normal speculum/bimanual exam. Partner is being treated as well.  - Treated with azithromycin 1g x1 at admission. CTX 1g also given for pyelonephritis covers GC - NAAT pending. HIV neg - Will add RPR  Hyponatremia: Hypovolemic, resolved. - Monitor  Elevated LFTs: Mild, resolved this AM. No evidence of cholestatic pattern. 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  - Hepatitis serologies pending  Anemia: Normocytic to microcytic.  No history consistent with bleeding. - Check ferritin and iron  Hypokalemia: Mild, resolved. - Monitor. K in IVF.  DVT prophylaxis: Lovenox Code Status: Full Family Communication: Pt declined offer to contact. Disposition Plan: Still unable to reliably take po. Will continue IVF, zofran prn, and abx.   Consultants:   None  Procedures:   None  Antimicrobials:  CTX (10/9 >> )   Subjective: Still nauseated and not ready for food yet. Bilateral flank pain remains. No emesis since admission. No diarrhea.  Objective: Vitals:   07/18/16 1730 07/18/16 1841 07/18/16 2110 07/19/16 0527  BP: 119/59 106/57 (!) 135/93 116/69  Pulse: 100 98 (!) 117 79  Resp: 16 17 17 17   Temp: 99 F (37.2 C) 98.5 F (36.9 C) (!) 102.9 F (39.4 C) 98 F (36.7 C)  TempSrc: Oral Oral Oral Oral  SpO2: 98% 100% 100% 100%  Weight:   90.7 kg (200 lb)   Height:        Intake/Output Summary (Last 24 hours) at 07/19/16 1329 Last data filed at 07/19/16 0600  Gross per 24 hour  Intake             2850 ml  Output                0 ml  Net             2850 ml   Filed Weights   07/18/16 2110  Weight: 90.7 kg (200 lb)    Examination: General exam: 20 y.o. female in some pain. Respiratory system: Non-labored breathing. Clear to auscultation bilaterally.  Cardiovascular system: Regular rate and rhythm. No murmur, rub, or gallop. No JVD, and no pedal  edema. Gastrointestinal system: Abdomen soft, tender generally worst in RLQ without rebound. Voluntary guarding improved with bent knees. +BS, + bilateral flank pain. Non-distended. No masses/hepatomegaly.  Central nervous system: Alert and oriented. No focal neurological deficits. Extremities: Warm, no deformities Skin: No rashes, lesions or ulcers Psychiatry: Judgement and insight appear normal. Mood & affect appropriate.   Data Reviewed: I have personally reviewed following labs and imaging studies  CBC:  Recent Labs Lab 07/18/16 1454  07/19/16 0518  WBC 22.5* 20.0*  NEUTROABS 18.4*  --   HGB 11.0* 9.6*  HCT 32.9* 29.5*  MCV 82.7 83.8  PLT 378 350   Basic Metabolic Panel:  Recent Labs Lab 07/18/16 1454 07/19/16 0518  NA 133* 140  K 3.3* 3.6  CL 99* 105  CO2 25 26  GLUCOSE 100* 107*  BUN 7 <5*  CREATININE 0.73 0.57  CALCIUM 8.3* 7.8*   GFR: Estimated Creatinine Clearance: 124.8 mL/min (by C-G formula based on SCr of 0.57 mg/dL). Liver Function Tests:  Recent Labs Lab 07/18/16 1454 07/18/16 2120 07/19/16 0518  AST 43*  --  24  ALT 55*  --  40  ALKPHOS 114  --  93  BILITOT 1.3* 1.5* 1.0  PROT 7.6  --  6.5  ALBUMIN 3.3*  --  2.7*    Recent Labs Lab 07/18/16 1454  LIPASE 20   No results for input(s): AMMONIA in the last 168 hours. Coagulation Profile: No results for input(s): INR, PROTIME in the last 168 hours. Cardiac Enzymes: No results for input(s): CKTOTAL, CKMB, CKMBINDEX, TROPONINI in the last 168 hours. BNP (last 3 results) No results for input(s): PROBNP in the last 8760 hours. HbA1C: No results for input(s): HGBA1C in the last 72 hours. CBG: No results for input(s): GLUCAP in the last 168 hours. Lipid Profile: No results for input(s): CHOL, HDL, LDLCALC, TRIG, CHOLHDL, LDLDIRECT in the last 72 hours. Thyroid Function Tests: No results for input(s): TSH, T4TOTAL, FREET4, T3FREE, THYROIDAB in the last 72 hours. Anemia Panel: No results for input(s): VITAMINB12, FOLATE, FERRITIN, TIBC, IRON, RETICCTPCT in the last 72 hours. Urine analysis:    Component Value Date/Time   COLORURINE YELLOW 07/18/2016 1806   APPEARANCEUR CLEAR 07/18/2016 1806   LABSPEC >1.046 (H) 07/18/2016 1806   PHURINE 6.0 07/18/2016 1806   GLUCOSEU NEGATIVE 07/18/2016 1806   HGBUR MODERATE (A) 07/18/2016 1806   BILIRUBINUR NEGATIVE 07/18/2016 1806   KETONESUR NEGATIVE 07/18/2016 1806   PROTEINUR NEGATIVE 07/18/2016 1806   NITRITE NEGATIVE 07/18/2016 1806   LEUKOCYTESUR MODERATE (A) 07/18/2016 1806    Sepsis Labs: @LABRCNTIP (procalcitonin:4,lacticidven:4)  ) Recent Results (from the past 240 hour(s))  Blood culture (routine x 2)     Status: None (Preliminary result)   Collection Time: 07/18/16  5:25 PM  Result Value Ref Range Status   Specimen Description BLOOD LEFT HAND  Final   Special Requests IN PEDIATRIC BOTTLE 2 CC  Final   Culture   Final    NO GROWTH < 24 HOURS Performed at [facility]    Report Status PENDING  Incomplete  Blood culture (routine x 2)     Status: None (Preliminary result)   Collection Time: 07/18/16  5:25 PM  Result Value Ref Range Status   Specimen Description BLOOD RIGHT ANTECUBITAL  Final   Special Requests BOTTLES DRAWN AEROBIC AND ANAEROBIC 5 CC EACH  Final   Culture   Final    NO GROWTH < 24 HOURS Performed at [facility]  Report Status PENDING  Incomplete     Radiology Studies: Dg Chest 2 View  Result Date: 07/18/2016 CLINICAL DATA:  Fever, cough. EXAM: CHEST  2 VIEW COMPARISON:  Radiographs of May 07, 2016. FINDINGS: The heart size and mediastinal contours are within normal limits. Both lungs are clear. No pneumothorax or pleural effusion is noted. The visualized skeletal structures are unremarkable. IMPRESSION: No active cardiopulmonary disease. Electronically Signed   By: Lupita RaiderJames  Green Jr, M.D.   On: 07/18/2016 16:12   Ct Abdomen Pelvis W Contrast  Result Date: 07/18/2016 CLINICAL DATA:  Fever and abdominal pain and body aches. EXAM: CT ABDOMEN AND PELVIS WITH CONTRAST TECHNIQUE: Multidetector CT imaging of the abdomen and pelvis was performed using the standard protocol following bolus administration of intravenous contrast. CONTRAST:  100mL ISOVUE-300 IOPAMIDOL (ISOVUE-300) INJECTION 61% COMPARISON:  None. FINDINGS: Lower chest: Slight bibasilar atelectasis posteriorly. No effusions. Heart size is normal. Hepatobiliary: Slight hepatomegaly with hepatic steatosis. Biliary tree is normal. Pancreas: Normal. Spleen: Normal.  Adrenals/Urinary Tract: There is multifocal bilateral pyelonephritis. There is a small amount of fluid in the perinephric space on the right. There is abnormal enhancement of the right ureter and right ureter slightly dilated to the pelvic inlet. There is no stone. The bladder appears normal. Adrenal glands are normal. Stomach/Bowel: Normal including the terminal ileum and appendix. Vascular/Lymphatic: Normal. Reproductive: Normal. Other: Tiny amount of free fluid in the pelvis, normal for a female of this age. No free air. Musculoskeletal: Normal. IMPRESSION: 1. Bilateral pyelonephritis, right worse than left. 2. Slight bibasilar atelectasis. 3. Slight hepatomegaly with hepatic steatosis. Electronically Signed   By: Francene BoyersJames  Maxwell M.D.   On: 07/18/2016 17:07    Scheduled Meds: . cefTRIAXone (ROCEPHIN) IVPB 1 gram/50 mL D5W  1 g Intravenous Q24H  . enoxaparin (LOVENOX) injection  40 mg Subcutaneous Q24H   Continuous Infusions: . 0.9 % NaCl with KCl 20 mEq / L 125 mL/hr at 07/18/16 2100     LOS: 0 days   Time spent: 25 minutes.  Hazeline Junkeryan Anjenette Gerbino, MD [facility] Pager [phone]  If 7PM-7AM, please contact night-coverage [email or web address] Password [email or web address] 07/19/2016, 1:29 PM

## 2016-07-20 ENCOUNTER — Telehealth (HOSPITAL_BASED_OUTPATIENT_CLINIC_OR_DEPARTMENT_OTHER): Payer: Self-pay

## 2016-07-20 LAB — BASIC METABOLIC PANEL
ANION GAP: 5 (ref 5–15)
CALCIUM: 8.1 mg/dL — AB (ref 8.9–10.3)
CO2: 25 mmol/L (ref 22–32)
CREATININE: 0.53 mg/dL (ref 0.44–1.00)
Chloride: 107 mmol/L (ref 101–111)
GFR calc Af Amer: >60 mL/min (ref >60)
GLUCOSE: 123 mg/dL — AB (ref 65–99)
POTASSIUM: 3.3 mmol/L — AB (ref 3.5–5.1)
Sodium: 137 mmol/L (ref 135–145)

## 2016-07-20 LAB — IRON AND TIBC
IRON: 34 ug/dL (ref 28–170)
Saturation Ratios: 21 % (ref 10.4–31.8)
TIBC: 160 ug/dL — ABNORMAL LOW (ref 250–450)
UIBC: 126 ug/dL

## 2016-07-20 LAB — CBC
HEMATOCRIT: 25.2 % — AB (ref 36.0–46.0)
Hemoglobin: 8.6 g/dL — ABNORMAL LOW (ref 12.0–15.0)
MCH: 28.4 pg (ref 26.0–34.0)
MCHC: 34.1 g/dL (ref 30.0–36.0)
MCV: 83.2 fL (ref 78.0–100.0)
Platelets: 359 10*3/uL (ref 150–400)
RBC: 3.03 MIL/uL — ABNORMAL LOW (ref 3.87–5.11)
RDW: 14.2 % (ref 11.5–15.5)
WBC: 15.3 10*3/uL — AB (ref 4.0–10.5)

## 2016-07-20 LAB — URINE CULTURE

## 2016-07-20 LAB — HEPATITIS PANEL, ACUTE
HCV Ab: <0.1 s/co ratio (ref 0.0–0.9)
HEP B S AG: NEGATIVE
Hep A IgM: NEGATIVE
Hep B C IgM: NEGATIVE

## 2016-07-20 LAB — RPR: RPR Ser Ql: NONREACTIVE

## 2016-07-20 LAB — FERRITIN: Ferritin: 256 ng/mL (ref 11–307)

## 2016-07-20 NOTE — Progress Notes (Signed)
PROGRESS NOTE  Connie Shea  ZOX:[id number] DOB: 11/16/1995 DOA: 07/18/2016 PCP: No PCP Per Patient   Brief Narrative: 20 y.o. female  1 week fever/chills, dysuria and now vomiting.  + urinary frequency, suprapubic pain, flank pain and foul-smelling urine.   flank pain is bilateral, achy and severe.  On arrival, temperature was 103.23F, HR 120s, BP 120/70. WBC 22.5k, Cr 0.73, UPT neg.  Urine showed hematuria and pyuria.  Blood and urine cultures were obtained, she was given 1 L normal saline, and ceftriaxone was administered.    Also states that she and her partner tested positive for chlamydia in the last few months, but did not get treatment.  She's had foul-smelling discharge, dyspareunia, stable spotting with nexplanon Admission exam documented pelvic as normal without CMT.   Assessment & Plan: Principal Problem:   Pyelonephritis Active Problems:   Hyponatremia   Elevated LFTs   Normocytic anemia   Hypokalemia   Acute pyelonephritis  Pyelonephritis:  Pyuria on UA, dysuria by history, bilateral renal stranding on CT without other abnormalities on abdomen/pelvis imaging. With SIRS at admission without end-organ dysfunction. Leukocytosis stable 22.5 > 20k1>15, lactate 1.33.  - Continue IVF's and zofran prn - Continue ceftriaxone IV - Follow urine culture  Chlamydia infection:  - Treated with azithromycin 1g x1 at admission. CTX 1g also given for pyelonephritis covers GC - NAAT pending. HIV neg - await RPR  Hyponatremia: Hypovolemic, resolved. - Monitor  Elevated LFTs:  Mild, resolved this AM. No evidence of cholestatic pattern. 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  - Hepatitis serologies neg so far  Anemia: Normocytic to microcytic. No history consistent with bleeding. - Check ferritin and iron  Hypokalemia: Mild, resolved. - Monitor. 20mEw K in IVF.  DVT prophylaxis: Lovenox Code Status: Full Family Communication: Pt  declined offer to contact.  Disposition Plan: home if able to tolerate po on 10.12.17  Consultants:   None  Procedures:   None  Antimicrobials:  CTX (10/9 >> )   Subjective:  Mild n Drinking well Some abd discomfort No n/v No cp No abd pain  Objective: Vitals:   07/19/16 1510 07/19/16 2124 07/20/16 0428 07/20/16 0827  BP: (!) 148/90 135/83 120/81 129/71  Pulse:  88 91 75  Resp:  16 20 20   Temp:  98.9 F (37.2 C) 99.1 F (37.3 C) 98.1 F (36.7 C)  TempSrc:  Oral Oral Oral  SpO2:  100% 99% 100%  Weight:      Height:        Intake/Output Summary (Last 24 hours) at 07/20/16 1000 Last data filed at 07/20/16 [id number]  Gross per 24 hour  Intake          3193.75 ml  Output                0 ml  Net          3193.75 ml   Filed Weights   07/18/16 2110  Weight: 90.7 kg (200 lb)    Examination: General exam: 20 y.o. female in some pain. Respiratory system: Clear to auscultation bilaterally.  Cardiovascular system: Regular rate and rhythm. No m/r/g Gastrointestinal system: Abdomen soft, tender  in RLQ without rebound. Voluntary guarding improved with bent knees. +BS, + bilateral flank pain. Non-distended. No masses/hepatomegaly.  Central nervous system: Alert and oriented. No focal neurological deficits. Extremities: Warm, no deformities Skin: No rashes, lesions or ulcers Psychiatry: Judgement and insight appear normal. Mood & affect appropriate.  Data Reviewed: I have personally reviewed following labs and imaging studies  CBC:  Recent Labs Lab 07/18/16 1454 07/19/16 0518 07/20/16 0554  WBC 22.5* 20.0* 15.3*  NEUTROABS 18.4*  --   --   HGB 11.0* 9.6* 8.6*  HCT 32.9* 29.5* 25.2*  MCV 82.7 83.8 83.2  PLT 378 350 359   Basic Metabolic Panel:  Recent Labs Lab 07/18/16 1454 07/19/16 0518 07/20/16 0554  NA 133* 140 137  K 3.3* 3.6 3.3*  CL 99* 105 107  CO2 25 26 25   GLUCOSE 100* 107* 123*  BUN 7 <5* <5*  CREATININE 0.73 0.57 0.53  CALCIUM 8.3*  7.8* 8.1*   GFR: Estimated Creatinine Clearance: 124.8 mL/min (by C-G formula based on SCr of 0.53 mg/dL). Liver Function Tests:  Recent Labs Lab 07/18/16 1454 07/18/16 2120 07/19/16 0518  AST 43*  --  24  ALT 55*  --  40  ALKPHOS 114  --  93  BILITOT 1.3* 1.5* 1.0  PROT 7.6  --  6.5  ALBUMIN 3.3*  --  2.7*    Recent Labs Lab 07/18/16 1454  LIPASE 20   No results for input(s): AMMONIA in the last 168 hours. Coagulation Profile: No results for input(s): INR, PROTIME in the last 168 hours. Cardiac Enzymes: No results for input(s): CKTOTAL, CKMB, CKMBINDEX, TROPONINI in the last 168 hours. BNP (last 3 results) No results for input(s): PROBNP in the last 8760 hours. HbA1C: No results for input(s): HGBA1C in the last 72 hours. CBG: No results for input(s): GLUCAP in the last 168 hours. Lipid Profile: No results for input(s): CHOL, HDL, LDLCALC, TRIG, CHOLHDL, LDLDIRECT in the last 72 hours. Thyroid Function Tests: No results for input(s): TSH, T4TOTAL, FREET4, T3FREE, THYROIDAB in the last 72 hours. Anemia Panel:  Recent Labs  07/20/16 0554  FERRITIN 256  TIBC 160*  IRON 34   Urine analysis:    Component Value Date/Time   COLORURINE YELLOW 07/18/2016 1806   APPEARANCEUR CLEAR 07/18/2016 1806   LABSPEC >1.046 (H) 07/18/2016 1806   PHURINE 6.0 07/18/2016 1806   GLUCOSEU NEGATIVE 07/18/2016 1806   HGBUR MODERATE (A) 07/18/2016 1806   BILIRUBINUR NEGATIVE 07/18/2016 1806   KETONESUR NEGATIVE 07/18/2016 1806   PROTEINUR NEGATIVE 07/18/2016 1806   NITRITE NEGATIVE 07/18/2016 1806   LEUKOCYTESUR MODERATE (A) 07/18/2016 1806   Sepsis Labs: @LABRCNTIP (procalcitonin:4,lacticidven:4)  ) Recent Results (from the past 240 hour(s))  Blood culture (routine x 2)     Status: None (Preliminary result)   Collection Time: 07/18/16  5:25 PM  Result Value Ref Range Status   Specimen Description BLOOD LEFT HAND  Final   Special Requests IN PEDIATRIC BOTTLE 2 CC  Final    Culture   Final    NO GROWTH < 24 HOURS Performed at [facility]    Report Status PENDING  Incomplete  Blood culture (routine x 2)     Status: None (Preliminary result)   Collection Time: 07/18/16  5:25 PM  Result Value Ref Range Status   Specimen Description BLOOD RIGHT ANTECUBITAL  Final   Special Requests BOTTLES DRAWN AEROBIC AND ANAEROBIC 5 CC EACH  Final   Culture   Final    NO GROWTH < 24 HOURS Performed at [facility]    Report Status PENDING  Incomplete  Urine culture     Status: Abnormal   Collection Time: 07/18/16  6:06 PM  Result Value Ref Range Status   Specimen Description URINE, CLEAN CATCH  Final  Special Requests NONE  Final   Culture MULTIPLE SPECIES PRESENT, SUGGEST RECOLLECTION (A)  Final   Report Status 07/20/2016 FINAL  Final     Radiology Studies: Dg Chest 2 View  Result Date: 07/18/2016 CLINICAL DATA:  Fever, cough. EXAM: CHEST  2 VIEW COMPARISON:  Radiographs of May 07, 2016. FINDINGS: The heart size and mediastinal contours are within normal limits. Both lungs are clear. No pneumothorax or pleural effusion is noted. The visualized skeletal structures are unremarkable. IMPRESSION: No active cardiopulmonary disease. Electronically Signed   By: Lupita Raider, M.D.   On: 07/18/2016 16:12   Ct Abdomen Pelvis W Contrast  Result Date: 07/18/2016 CLINICAL DATA:  Fever and abdominal pain and body aches. EXAM: CT ABDOMEN AND PELVIS WITH CONTRAST TECHNIQUE: Multidetector CT imaging of the abdomen and pelvis was performed using the standard protocol following bolus administration of intravenous contrast. CONTRAST:  ISOVUE-300 IOPAMIDOL (ISOVUE-300) INJECTION 61% COMPARISON:  None. FINDINGS: Lower chest: Slight bibasilar atelectasis posteriorly. No effusions. Heart size is normal. Hepatobiliary: Slight hepatomegaly with hepatic steatosis. Biliary tree is normal. Pancreas: Normal. Spleen: Normal. Adrenals/Urinary Tract: There is multifocal  bilateral pyelonephritis. There is a small amount of fluid in the perinephric space on the right. There is abnormal enhancement of the right ureter and right ureter slightly dilated to the pelvic inlet. There is no stone. The bladder appears normal. Adrenal glands are normal. Stomach/Bowel: Normal including the terminal ileum and appendix. Vascular/Lymphatic: Normal. Reproductive: Normal. Other: Tiny amount of free fluid in the pelvis, normal for a female of this age. No free air. Musculoskeletal: Normal. IMPRESSION: 1. Bilateral pyelonephritis, right worse than left. 2. Slight bibasilar atelectasis. 3. Slight hepatomegaly with hepatic steatosis. Electronically Signed   By: Francene Boyers M.D.   On: 07/18/2016 17:07    Scheduled Meds: . cefTRIAXone (ROCEPHIN) IVPB 1 gram/50 mL D5W  1 g Intravenous Q24H  . enoxaparin (LOVENOX) injection  40 mg Subcutaneous Q24H   Continuous Infusions: . 0.9 % NaCl with KCl 20 mEq / L 125 mL/hr at 07/20/16 0249     LOS: 1 day   Time spent: 25 minutes. Pleas Koch, MD [phone]   If 7PM-7AM, please contact night-coverage [email or web address] Password [email or web address] 07/20/2016, 10:00 AM

## 2016-07-21 ENCOUNTER — Encounter: Payer: Self-pay | Admitting: Family Medicine

## 2016-07-21 LAB — BASIC METABOLIC PANEL
Anion gap: 8 (ref 5–15)
CHLORIDE: 104 mmol/L (ref 101–111)
CO2: 27 mmol/L (ref 22–32)
Calcium: 8.8 mg/dL — ABNORMAL LOW (ref 8.9–10.3)
Creatinine, Ser: 0.57 mg/dL (ref 0.44–1.00)
GFR calc Af Amer: >60 mL/min (ref >60)
GFR calc non Af Amer: >60 mL/min (ref >60)
Glucose, Bld: 87 mg/dL (ref 65–99)
POTASSIUM: 4 mmol/L (ref 3.5–5.1)
SODIUM: 139 mmol/L (ref 135–145)

## 2016-07-21 LAB — CBC WITH DIFFERENTIAL/PLATELET
BASOS PCT: 0 %
Basophils Absolute: 0 10*3/uL (ref 0.0–0.1)
EOS ABS: 0.4 10*3/uL (ref 0.0–0.7)
EOS PCT: 3 %
HCT: 28.7 % — ABNORMAL LOW (ref 36.0–46.0)
HEMOGLOBIN: 9.7 g/dL — AB (ref 12.0–15.0)
LYMPHS PCT: 26 %
Lymphs Abs: 3.2 10*3/uL (ref 0.7–4.0)
MCH: 28 pg (ref 26.0–34.0)
MCHC: 33.8 g/dL (ref 30.0–36.0)
MCV: 82.9 fL (ref 78.0–100.0)
MONOS PCT: 11 %
Monocytes Absolute: 1.4 10*3/uL — ABNORMAL HIGH (ref 0.1–1.0)
NEUTROS PCT: 60 %
Neutro Abs: 7.3 10*3/uL (ref 1.7–7.7)
Platelets: 455 10*3/uL — ABNORMAL HIGH (ref 150–400)
RBC: 3.46 MIL/uL — ABNORMAL LOW (ref 3.87–5.11)
RDW: 14.3 % (ref 11.5–15.5)
WBC: 12.3 10*3/uL — ABNORMAL HIGH (ref 4.0–10.5)

## 2016-07-21 LAB — HIV-1 RNA, QUALITATIVE, TMA: HIV-1 RNA, Qualitative, TMA: NEGATIVE

## 2016-07-21 MED ORDER — IBUPROFEN 400 MG PO TABS: 400.0000 mg | ORAL_TABLET | Freq: Four times a day (QID) | ORAL | 0 refills | Status: DC | PRN

## 2016-07-21 MED ORDER — ACYCLOVIR 400 MG PO TABS: 400.0000 mg | ORAL_TABLET | Freq: Two times a day (BID) | ORAL | 0 refills | Status: DC

## 2016-07-21 MED ORDER — LEVOFLOXACIN 500 MG PO TABS
500.0000 mg | ORAL_TABLET | Freq: Every day | ORAL | Status: DC
  Filled 2016-07-21: qty 1

## 2016-07-21 MED ORDER — LEVOFLOXACIN 500 MG PO TABS: 500.0000 mg | ORAL_TABLET | Freq: Every day | ORAL | 0 refills | Status: DC

## 2016-07-21 MED FILL — levoFLOXacin 500 MG TABS: 500 | 4 days supply | Qty: 4 | Fill #0

## 2016-07-21 MED FILL — ACYCLOVIR 400 MG TABLET: 400 | 30 days supply | Qty: 60 | Fill #0

## 2016-07-21 NOTE — Progress Notes (Signed)
Discharge instructions discussed with patient, patient verbalized understanding of all. [facility]. In no distress. VSS. Taken via wheelchair to car with mother to drive her home.

## 2016-07-21 NOTE — Discharge Summary (Signed)
Physician Discharge Summary  Connie MillerCamerie Klontz NWG:[id number]:[id number] DOB: 08-23-1996 DOA: 07/18/2016  PCP: No PCP Per Patient  Admit date: 07/18/2016 Discharge date: 07/21/2016  Time spent: 20 minutes  Recommendations for Outpatient Follow-up:  1. 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 Given work-note on d/c  Discharge Diagnoses:  Final diagnosis Chlamydia PID and urinary infection-pyelo Principal Problem:   Pyelonephritis Active Problems:   Hyponatremia   Elevated LFTs   Normocytic anemia   Hypokalemia   Acute pyelonephritis   Discharge Condition: fair  Diet recommendation: reg  Filed Weights   07/18/16 2110  Weight: 90.7 kg (200 lb)    History of present illness:  20 y.o.female  1 week fever/chills, dysuria and now vomiting.  + urinary frequency, suprapubic pain, flank pain and foul-smelling urine.  flank pain is bilateral, achy andsevere.  On arrival, temperature was 103.18F, HR 120s, BP 120/70. WBC 22.5k, Cr 0.73, UPT neg.  Urine showed hematuria and pyuria.  Blood and urine cultures were obtained, she was given 1 L normal saline, and ceftriaxone was administered.    Also states that she and her partner tested positive for chlamydia in the last few months, but did not get treatment.  She's had foul-smelling discharge, dyspareunia, stable spotting with nexplanon  Hospital Course:  Pyelonephritis: Pyuria on UA, dysuria by history, bilateral renal stranding on CT without other abnormalities on abdomen/pelvis imaging.  With SIRS at admission without end-organ dysfunction. Leukocytosis stable 22.5 > 20k1>15, lactate 1.33.  -Urine culture non-revealing -Transitioned to PO levaquin-looking much better on day of d/c  Chlamydia infection:  - Treated with azithromycin 1g x1 at admission. CTX 1g also given for pyelonephritis covers GC - HIV neg - RPR neg  Hyponatremia:Hypovolemic, resolved. - Monitor  Elevated LFTs: Mild, resolved  this AM. No evidence of cholestatic pattern.  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  - Hepatitis serologies neg   Anemia:Normocytic to microcytic.No history consistent with bleeding. - Check ferritin and iron  Hypokalemia:Mild, resolved. - Monitor.   Discharge Exam: Vitals:   07/20/16 2141 07/21/16 0550  BP: 113/71 122/81  Pulse: 86 68  Resp: 19 19  Temp: 98.1 F (36.7 C) 98.4 F (36.9 C)    General: eomi ncat Cardiovascular: s1 s2 no m/r/g Respiratory: clear no added sond  Discharge Instructions   Discharge Instructions    Diet - low sodium heart healthy    Complete by:  As directed    Discharge instructions    Complete by:  As directed    Follow with your own doctor for routine care You should continue your mediciations without change Please take your Acyclovir and get refills as OP Complete levaquin Rx for Urinary infection   Increase activity slowly    Complete by:  As directed      Current Discharge Medication List    START taking these medications   Details  acyclovir (ZOVIRAX) 400 MG tablet Take 1 tablet (400 mg total) by mouth 2 (two) times daily. Qty: 60 tablet, Refills: 0    ibuprofen (ADVIL,MOTRIN) 400 MG tablet Take 1 tablet (400 mg total) by mouth every 6 (six) hours as needed for fever or moderate pain. Qty: 30 tablet, Refills: 0    levofloxacin (LEVAQUIN) 500 MG tablet Take 1 tablet (500 mg total) by mouth daily. Qty: 4 tablet, Refills: 0      CONTINUE these medications which have NOT CHANGED   Details  etonogestrel (IMPLANON) 68 MG IMPL  implant 1 each by Subdermal route once.       No Known Allergies    The results of significant diagnostics from this hospitalization (including imaging, microbiology, ancillary and laboratory) are listed below for reference.    Significant Diagnostic Studies: Dg Chest 2 View  Result Date: 07/18/2016 CLINICAL DATA:  Fever, cough. EXAM: CHEST  2 VIEW COMPARISON:   Radiographs of May 07, 2016. FINDINGS: The heart size and mediastinal contours are within normal limits. Both lungs are clear. No pneumothorax or pleural effusion is noted. The visualized skeletal structures are unremarkable. IMPRESSION: No active cardiopulmonary disease. Electronically Signed   By: Lupita Raider, M.D.   On: 07/18/2016 16:12   Ct Abdomen Pelvis W Contrast  Result Date: 07/18/2016 CLINICAL DATA:  Fever and abdominal pain and body aches. EXAM: CT ABDOMEN AND PELVIS WITH CONTRAST TECHNIQUE: Multidetector CT imaging of the abdomen and pelvis was performed using the standard protocol following bolus administration of intravenous contrast. CONTRAST:  ISOVUE-300 IOPAMIDOL (ISOVUE-300) INJECTION 61% COMPARISON:  None. FINDINGS: Lower chest: Slight bibasilar atelectasis posteriorly. No effusions. Heart size is normal. Hepatobiliary: Slight hepatomegaly with hepatic steatosis. Biliary tree is normal. Pancreas: Normal. Spleen: Normal. Adrenals/Urinary Tract: There is multifocal bilateral pyelonephritis. There is a small amount of fluid in the perinephric space on the right. There is abnormal enhancement of the right ureter and right ureter slightly dilated to the pelvic inlet. There is no stone. The bladder appears normal. Adrenal glands are normal. Stomach/Bowel: Normal including the terminal ileum and appendix. Vascular/Lymphatic: Normal. Reproductive: Normal. Other: Tiny amount of free fluid in the pelvis, normal for a female of this age. No free air. Musculoskeletal: Normal. IMPRESSION: 1. Bilateral pyelonephritis, right worse than left. 2. Slight bibasilar atelectasis. 3. Slight hepatomegaly with hepatic steatosis. Electronically Signed   By: Francene Boyers M.D.   On: 07/18/2016 17:07    Microbiology: Recent Results (from the past 240 hour(s))  Blood culture (routine x 2)     Status: None (Preliminary result)   Collection Time: 07/18/16  5:25 PM  Result Value Ref Range Status    Specimen Description BLOOD LEFT HAND  Final   Special Requests IN PEDIATRIC BOTTLE 2 CC  Final   Culture   Final    NO GROWTH 2 DAYS Performed at [facility]    Report Status PENDING  Incomplete  Blood culture (routine x 2)     Status: None (Preliminary result)   Collection Time: 07/18/16  5:25 PM  Result Value Ref Range Status   Specimen Description BLOOD RIGHT ANTECUBITAL  Final   Special Requests BOTTLES DRAWN AEROBIC AND ANAEROBIC 5 CC EACH  Final   Culture   Final    NO GROWTH 2 DAYS Performed at [facility]    Report Status PENDING  Incomplete  Urine culture     Status: Abnormal   Collection Time: 07/18/16  6:06 PM  Result Value Ref Range Status   Specimen Description URINE, CLEAN CATCH  Final   Special Requests NONE  Final   Culture MULTIPLE SPECIES PRESENT, SUGGEST RECOLLECTION (A)  Final   Report Status 07/20/2016 FINAL  Final     Labs: Basic Metabolic Panel:  Recent Labs Lab 07/18/16 1454 07/19/16 0518 07/20/16 0554 07/21/16 0535  NA 133* 140 137 139  K 3.3* 3.6 3.3* 4.0  CL 99* 105 107 104  CO2 25 26 25 27   GLUCOSE 100* 107* 123* 87  BUN 7 <5* <5* <5*  CREATININE 0.73  0.57 0.53 0.57  CALCIUM 8.3* 7.8* 8.1* 8.8*   Liver Function Tests:  Recent Labs Lab 07/18/16 1454 07/18/16 2120 07/19/16 0518  AST 43*  --  24  ALT 55*  --  40  ALKPHOS 114  --  93  BILITOT 1.3* 1.5* 1.0  PROT 7.6  --  6.5  ALBUMIN 3.3*  --  2.7*    Recent Labs Lab 07/18/16 1454  LIPASE 20   No results for input(s): AMMONIA in the last 168 hours. CBC:  Recent Labs Lab 07/18/16 1454 07/19/16 0518 07/20/16 0554 07/21/16 0535  WBC 22.5* 20.0* 15.3* 12.3*  NEUTROABS 18.4*  --   --  7.3  HGB 11.0* 9.6* 8.6* 9.7*  HCT 32.9* 29.5* 25.2* 28.7*  MCV 82.7 83.8 83.2 82.9  PLT 378 350 359 455*   Cardiac Enzymes: No results for input(s): CKTOTAL, CKMB, CKMBINDEX, TROPONINI in the last 168 hours. BNP: BNP (last 3 results) No results for input(s): BNP in  the last 8760 hours.  ProBNP (last 3 results) No results for input(s): PROBNP in the last 8760 hours.  CBG: No results for input(s): GLUCAP in the last 168 hours.     SignedRhetta Mura MD   [facility] 07/21/2016, 8:45 AM

## 2016-07-21 NOTE — Progress Notes (Signed)
Date: July 21, 2016 Discharge orders checked for needs. Match program for medication performed and given to patient/information concerning the use of the [facility] given to patient with instruction to call on Friday for an appointment next week as a hospital f/u appt. Marcelle Smilinghonda Davis, RN, BSN, [place]   [phone]

## 2016-07-23 LAB — CULTURE, BLOOD (ROUTINE X 2)
Culture: NO GROWTH
Culture: NO GROWTH

## 2016-08-08 ENCOUNTER — Encounter (HOSPITAL_COMMUNITY): Payer: Self-pay | Admitting: Emergency Medicine

## 2016-08-08 ENCOUNTER — Emergency Department (HOSPITAL_COMMUNITY)
Admission: EM | Admit: 2016-08-08 | Discharge: 2016-08-08 | Disposition: A | Discharge status: Home / Self Care | Payer: Medicaid Other | Attending: Emergency Medicine | Admitting: Emergency Medicine

## 2016-08-08 DIAGNOSIS — R112 Nausea with vomiting, unspecified: Secondary | ICD-10-CM | POA: Insufficient documentation

## 2016-08-08 DIAGNOSIS — R1084 Generalized abdominal pain: Secondary | ICD-10-CM | POA: Diagnosis not present

## 2016-08-08 DIAGNOSIS — Z79899 Other long term (current) drug therapy: Secondary | ICD-10-CM | POA: Insufficient documentation

## 2016-08-08 DIAGNOSIS — R197 Diarrhea, unspecified: Secondary | ICD-10-CM | POA: Insufficient documentation

## 2016-08-08 HISTORY — DX: Renal tubulo-interstitial disease, unspecified: N15.9

## 2016-08-08 LAB — CBC
HEMATOCRIT: 37.3 % (ref 36.0–46.0)
HEMOGLOBIN: 12.5 g/dL (ref 12.0–15.0)
MCH: 28.5 pg (ref 26.0–34.0)
MCHC: 33.5 g/dL (ref 30.0–36.0)
MCV: 85.2 fL (ref 78.0–100.0)
Platelets: 429 10*3/uL — ABNORMAL HIGH (ref 150–400)
RBC: 4.38 MIL/uL (ref 3.87–5.11)
RDW: 15.1 % (ref 11.5–15.5)
WBC: 9.8 10*3/uL (ref 4.0–10.5)

## 2016-08-08 LAB — COMPREHENSIVE METABOLIC PANEL
ALT: 11 U/L — AB (ref 14–54)
ANION GAP: 8 (ref 5–15)
AST: 16 U/L (ref 15–41)
Albumin: 4.1 g/dL (ref 3.5–5.0)
Alkaline Phosphatase: 63 U/L (ref 38–126)
BUN: 11 mg/dL (ref 6–20)
CHLORIDE: 106 mmol/L (ref 101–111)
CO2: 25 mmol/L (ref 22–32)
Calcium: 9.2 mg/dL (ref 8.9–10.3)
Creatinine, Ser: 0.66 mg/dL (ref 0.44–1.00)
GFR calc non Af Amer: >60 mL/min (ref >60)
Glucose, Bld: 77 mg/dL (ref 65–99)
Potassium: 3.9 mmol/L (ref 3.5–5.1)
SODIUM: 139 mmol/L (ref 135–145)
Total Bilirubin: 0.6 mg/dL (ref 0.3–1.2)
Total Protein: 7.7 g/dL (ref 6.5–8.1)

## 2016-08-08 LAB — LIPASE, BLOOD: LIPASE: 27 U/L (ref 11–51)

## 2016-08-08 LAB — URINALYSIS, ROUTINE W REFLEX MICROSCOPIC
Bilirubin Urine: NEGATIVE
GLUCOSE, UA: NEGATIVE mg/dL
Ketones, ur: NEGATIVE mg/dL
Leukocytes, UA: NEGATIVE
Nitrite: NEGATIVE
Protein, ur: NEGATIVE mg/dL
SPECIFIC GRAVITY, URINE: 1.015 (ref 1.005–1.030)
pH: 6 (ref 5.0–8.0)

## 2016-08-08 LAB — I-STAT BETA HCG BLOOD, ED (MC, WL, AP ONLY)

## 2016-08-08 LAB — URINE MICROSCOPIC-ADD ON

## 2016-08-08 MED ORDER — BISMUTH SUBSALICYLATE 262 MG/15ML PO SUSP: 30.0000 mL | Freq: Four times a day (QID) | ORAL | 0 refills | Status: DC | PRN

## 2016-08-08 NOTE — ED Notes (Signed)
MD at bedside. 

## 2016-08-08 NOTE — ED Triage Notes (Signed)
Pt c/o vomiting and diarrhea that began today; reports 4-5 episodes of emesis and about 6 episodes of diarrhea; pt was hospitalized for kidney infection at the beginning of the month and was on antibiotics in the past 2 weeks

## 2016-08-08 NOTE — Progress Notes (Signed)
Patient noted to have been admitted and discharged from hospital from 10/09 to 10/12.  Patient noted to have Medicaid [employer] insurance.  Patient reports she was seen by the financial counselor while in the hospital.  Patient reports she was able to obtain her antibiotics from the out patient pharmacy.  Per chart review, [facility] before [facility].  Patient reports she has not called to schedule an [facility].  EDCM received patient's permission to [facility] efforts to obtain an appointment.  EDCM also provided patient the following:   [facility] provided [facility] with [facility], informed patient of services there and walk in times.  EDCM also provided patient with list of pcps who accept self pay patients, list of discount pharmacies and websites needymeds.org and GoodRX.com for medication assistance, phone number to inquire about the orange card, phone number to inquire about Medicaid, phone number to inquire about the Affordable Care Act, financial resources in the community such as local churches, [employer], urban ministries, and dental assistance for uninsured patients.  Patient thankful for resources.  No further EDCM needs at this time.

## 2016-08-08 NOTE — ED Provider Notes (Signed)
[facility]-EMERGENCY DEPT Provider Note   CSN: [id number] Arrival date & time: 08/08/16  2038     History   Chief Complaint Chief Complaint  Patient presents with  . Emesis  . Diarrhea    HPI Connie Shea is a 20 y.o. female.  The history is provided by the patient.  Diarrhea   This is a new problem. The current episode started more than 2 days ago. The problem occurs 2 to 4 times per day. The problem has not changed since onset.The stool consistency is described as watery. There has been no fever. Associated symptoms include abdominal pain (cramping diffusely). Pertinent negatives include no sweats. She has tried nothing for the symptoms. Risk factors: recent antibiotics.    Past Medical History:  Diagnosis Date  . Kidney infection   . Pre-eclampsia   . Sexually transmitted disease     Patient Active Problem List   Diagnosis Date Noted  . Acute pyelonephritis 07/19/2016  . Pyelonephritis 07/18/2016  . Hyponatremia 07/18/2016  . Elevated LFTs 07/18/2016  . Normocytic anemia 07/18/2016  . Hypokalemia 07/18/2016    Past Surgical History:  Procedure Laterality Date  . CESAREAN SECTION    . CESAREAN SECTION      OB History    No data available       Home Medications    Prior to Admission medications   Medication Sig Start Date End Date Taking? Authorizing Provider  acyclovir (ZOVIRAX) 400 MG tablet Take 1 tablet (400 mg total) by mouth 2 (two) times daily. Patient not taking: Reported on 08/08/2016 07/21/16   Rhetta MuraJai-Gurmukh Samtani, MD  etonogestrel (IMPLANON) 68 MG IMPL implant 1 each by Subdermal route once.    Historical Provider, MD  ibuprofen (ADVIL,MOTRIN) 400 MG tablet Take 1 tablet (400 mg total) by mouth every 6 (six) hours as needed for fever or moderate pain. Patient not taking: Reported on 08/08/2016 07/21/16   Rhetta MuraJai-Gurmukh Samtani, MD  levofloxacin (LEVAQUIN) 500 MG tablet Take 1 tablet (500 mg total) by mouth daily. Patient not taking: Reported on  08/08/2016 07/21/16   Rhetta MuraJai-Gurmukh Samtani, MD    Family History Family History  Problem Relation Age of Onset  . Hypertension Mother   . Asthma Mother   . Hypertension Maternal Aunt     Social History Social History  Substance Use Topics  . Smoking status: Never Smoker  . Smokeless tobacco: Never Used  . Alcohol use Yes     Comment: 1 day a week     Allergies   Review of patient's allergies indicates no known allergies.   Review of Systems Review of Systems  Gastrointestinal: Positive for abdominal pain (cramping diffusely) and diarrhea.  All other systems reviewed and are negative.    Physical Exam Updated Vital Signs BP (!) 146/105 (BP Location: Left Arm)   Pulse 80   Temp 98.8 F (37.1 C) (Oral)   Resp 14   LMP 07/04/2016   SpO2 97%   Physical Exam  Constitutional: She is oriented to person, place, and time. She appears well-developed and well-nourished. No distress.  HENT:  Head: Normocephalic.  Nose: Nose normal.  Eyes: Conjunctivae are normal.  Neck: Neck supple. No tracheal deviation present.  Cardiovascular: Normal rate, regular rhythm and normal heart sounds.   Pulmonary/Chest: Effort normal and breath sounds normal. No respiratory distress.  Abdominal: Soft. She exhibits no distension. There is no tenderness. There is no rebound and no guarding.  Neurological: She is alert and oriented to person, place, and time.  Skin: Skin is warm and dry.  Psychiatric: She has a normal mood and affect.  Vitals reviewed.    ED Treatments / Results  Labs (all labs ordered are listed, but only abnormal results are displayed) Labs Reviewed  COMPREHENSIVE METABOLIC PANEL - Abnormal; Notable for the following:       Result Value   ALT 11 (*)    All other components within normal limits  CBC - Abnormal; Notable for the following:    Platelets 429 (*)    All other components within normal limits  URINALYSIS, ROUTINE W REFLEX MICROSCOPIC (NOT AT [facility]) -  Abnormal; Notable for the following:    Hgb urine dipstick SMALL (*)    All other components within normal limits  URINE MICROSCOPIC-ADD ON - Abnormal; Notable for the following:    Squamous Epithelial / LPF 0-5 (*)    Bacteria, UA FEW (*)    All other components within normal limits  LIPASE, BLOOD  I-STAT BETA HCG BLOOD, ED ([facility], [facility], [facility] ONLY)    EKG  EKG Interpretation None       Radiology No results found.  Procedures Procedures (including critical care time)  Medications Ordered in ED Medications - No data to display   Initial Impression / Assessment and Plan / ED Course  I have reviewed the triage vital signs and the nursing notes.  Pertinent labs & imaging results that were available during my care of the patient were reviewed by me and considered in my medical decision making (see chart for details).  Clinical Course    20 y.o. female presents with N/V/D over last few days. Has h/o recent pyelo which resolved with ABx. No signs of recurrence. Likely viral/slef limited in nature or related to recent ABx but no signs of c diff. Labs and exam reassuring. Recommended supportive care with bismuth salts and probiotics. Plan to follow up with PCP as needed and return precautions discussed for worsening or new concerning symptoms.   Final Clinical Impressions(s) / ED Diagnoses   Final diagnoses:  Nausea vomiting and diarrhea    New Prescriptions Discharge Medication List as of 08/08/2016 11:06 PM    START taking these medications   Details  bismuth subsalicylate (PEPTO-BISMOL) 262 MG/15ML suspension Take 30 mLs by mouth every 6 (six) hours as needed., Starting Mon 08/08/2016, Print         Lyndal Pulleyaniel Daveon Arpino, MD 08/09/16 [phone]

## 2016-08-08 NOTE — ED Notes (Signed)
Patient was alert, oriented and stable upon discharge. RN went over AVS and patient had no further questions.  

## 2016-08-09 ENCOUNTER — Telehealth: Payer: Self-pay

## 2016-08-09 NOTE — Telephone Encounter (Signed)
Message received from Radford PaxAmy Ferrero, RN CM requesting [facility]. Informed her that there are not any appointments available at this time.

## 2016-08-12 ENCOUNTER — Telehealth: Payer: Self-pay

## 2016-08-12 NOTE — Telephone Encounter (Signed)
This Case Manager received communication from Radford PaxAmy Ferrero, RN CM that patient needing ED follow-up appointment. Call placed to #[phone] to discuss scheduling appointment; however, unable to reach patient. Unable to leave voicemail as mailbox had not yet been set-up.

## 2016-12-13 ENCOUNTER — Telehealth: Payer: Self-pay | Admitting: *Deleted

## 2016-12-13 NOTE — Telephone Encounter (Signed)
Left VM requesting call back for PreVisit. 

## 2016-12-15 ENCOUNTER — Ambulatory Visit: Payer: Medicaid Other | Admitting: Family Medicine

## 2016-12-15 DIAGNOSIS — Z0289 Encounter for other administrative examinations: Secondary | ICD-10-CM

## 2016-12-20 ENCOUNTER — Emergency Department (HOSPITAL_COMMUNITY)
Admission: EM | Admit: 2016-12-20 | Discharge: 2016-12-20 | Disposition: A | Discharge status: Home / Self Care | Payer: Managed Care, Other (non HMO) | Attending: Emergency Medicine | Admitting: Emergency Medicine

## 2016-12-20 ENCOUNTER — Encounter (HOSPITAL_COMMUNITY): Payer: Self-pay | Admitting: Emergency Medicine

## 2016-12-20 ENCOUNTER — Emergency Department (HOSPITAL_COMMUNITY): Payer: Managed Care, Other (non HMO)

## 2016-12-20 DIAGNOSIS — R51 Headache: Secondary | ICD-10-CM | POA: Diagnosis not present

## 2016-12-20 DIAGNOSIS — R519 Headache, unspecified: Secondary | ICD-10-CM

## 2016-12-20 LAB — I-STAT CHEM 8, ED
BUN: 7 mg/dL (ref 6–20)
CALCIUM ION: 1.13 mmol/L — AB (ref 1.15–1.40)
CHLORIDE: 106 mmol/L (ref 101–111)
Creatinine, Ser: 0.5 mg/dL (ref 0.44–1.00)
Glucose, Bld: 82 mg/dL (ref 65–99)
HCT: 36 % (ref 36.0–46.0)
Hemoglobin: 12.2 g/dL (ref 12.0–15.0)
POTASSIUM: 3.8 mmol/L (ref 3.5–5.1)
SODIUM: 140 mmol/L (ref 135–145)
TCO2: 25 mmol/L (ref 0–100)

## 2016-12-20 MED ORDER — SODIUM CHLORIDE 0.9 % IV BOLUS (SEPSIS)
1000.0000 mL | Freq: Once | INTRAVENOUS | Status: AC
  Administered 2016-12-20: 1000 mL via INTRAVENOUS

## 2016-12-20 MED ORDER — DIPHENHYDRAMINE HCL 50 MG/ML IJ SOLN
25.0000 mg | Freq: Once | INTRAMUSCULAR | Status: AC
  Administered 2016-12-20: 25 mg via INTRAVENOUS
  Filled 2016-12-20: qty 1

## 2016-12-20 MED ORDER — DEXAMETHASONE SODIUM PHOSPHATE 10 MG/ML IJ SOLN
10.0000 mg | Freq: Once | INTRAMUSCULAR | Status: AC
  Administered 2016-12-20: 10 mg via INTRAVENOUS
  Filled 2016-12-20: qty 1

## 2016-12-20 MED ORDER — METOCLOPRAMIDE HCL 5 MG/ML IJ SOLN
10.0000 mg | Freq: Once | INTRAMUSCULAR | Status: AC
  Administered 2016-12-20: 10 mg via INTRAVENOUS
  Filled 2016-12-20: qty 2

## 2016-12-20 NOTE — ED Triage Notes (Signed)
Patient c/o frontal headache that has been intermittent over the past 4 days. Patient has blurred vision with headaches.  Patient changed out her contacts and thought could be cause but still persisting. Patient last saw eye MD in Jan 2018.

## 2016-12-20 NOTE — ED Provider Notes (Signed)
[facility]-EMERGENCY DEPT Provider Note   CSN: [id number] Arrival date & time: 12/20/16  [id number]     History   Chief Complaint Chief Complaint  Patient presents with  . Blurred Vision  . Headache    HPI Connie Shea is a 21 y.o. female.  Patient presents to the emergency department with chief complaint of headache. She states that she has been having a headache for the past 2 weeks. He states that she believes it is because her blood pressure has been running high. She does not take blood pressure medication. She states that she has had some blurred vision and lightheadedness. She states this feels similar to when her blood pressure ran high while she was pregnant. Denies any numbness, weakness, or tingling. Denies any speech changes. She has not taken anything for her symptoms. She denies any fevers, chills, nausea, or vomiting.   The history is provided by the patient. No language interpreter was used.    Past Medical History:  Diagnosis Date  . Kidney infection   . Pre-eclampsia   . Sexually transmitted disease     Patient Active Problem List   Diagnosis Date Noted  . Acute pyelonephritis 07/19/2016  . Pyelonephritis 07/18/2016  . Hyponatremia 07/18/2016  . Elevated LFTs 07/18/2016  . Normocytic anemia 07/18/2016  . Hypokalemia 07/18/2016    Past Surgical History:  Procedure Laterality Date  . CESAREAN SECTION    . CESAREAN SECTION      OB History    No data available       Home Medications    Prior to Admission medications   Medication Sig Start Date End Date Taking? Authorizing Provider  etonogestrel (IMPLANON) 68 MG IMPL implant 1 each by Subdermal route once.   Yes Historical Provider, MD  ibuprofen (ADVIL,MOTRIN) 800 MG tablet Take 800 mg by mouth every 8 (eight) hours as needed for mild pain or moderate pain.   Yes Historical Provider, MD    Family History Family History  Problem Relation Age of Onset  . Hypertension Mother   . Asthma Mother   .  Hypertension Maternal Aunt     Social History Social History  Substance Use Topics  . Smoking status: Never Smoker  . Smokeless tobacco: Never Used  . Alcohol use Yes     Comment: 1 day a week     Allergies   Patient has no known allergies.   Review of Systems Review of Systems  All other systems reviewed and are negative.    Physical Exam Updated Vital Signs BP 120/86 (BP Location: Left Arm)   Pulse 86   Temp 98.3 F (36.8 C) (Oral)   Resp 12   SpO2 100%   Physical Exam  Constitutional: She is oriented to person, place, and time. She appears well-developed and well-nourished.  HENT:  Head: Normocephalic and atraumatic.  Right Ear: External ear normal.  Left Ear: External ear normal.  Eyes: Conjunctivae and EOM are normal. Pupils are equal, round, and reactive to light.  Neck: Normal range of motion. Neck supple.  No pain with neck flexion, no meningismus  Cardiovascular: Normal rate, regular rhythm and normal heart sounds.  Exam reveals no gallop and no friction rub.   No murmur heard. Pulmonary/Chest: Effort normal and breath sounds normal. No respiratory distress. She has no wheezes. She has no rales. She exhibits no tenderness.  Abdominal: Soft. She exhibits no distension and no mass. There is no tenderness. There is no rebound and no guarding.  Musculoskeletal:  Normal range of motion. She exhibits no edema or tenderness.  Normal gait.  Neurological: She is alert and oriented to person, place, and time. She has normal reflexes.  CN 3-12 intact, normal finger to nose, no pronator drift, sensation and strength intact bilaterally.  Skin: Skin is warm and dry.  Psychiatric: She has a normal mood and affect. Her behavior is normal. Judgment and thought content normal.  Nursing note and vitals reviewed.    ED Treatments / Results  Labs (all labs ordered are listed, but only abnormal results are displayed) Labs Reviewed  I-STAT CHEM 8, ED - Abnormal; Notable  for the following:       Result Value   Calcium, Ion 1.13 (*)    All other components within normal limits  I-STAT CHEM 8, ED    EKG  EKG Interpretation None       Radiology Ct Head Wo Contrast  Result Date: 12/20/2016 CLINICAL DATA:  Frontal and bitemporal headache for 2 weeks, some blurred vision and dizziness EXAM: CT HEAD WITHOUT CONTRAST TECHNIQUE: Contiguous axial images were obtained from the base of the skull through the vertex without intravenous contrast. COMPARISON:  None. FINDINGS: Brain: The ventricular system is normal in size and configuration and the septum is in a normal midline position. The fourth ventricle and basilar cisterns are unremarkable. No hemorrhage, mass lesion, or acute infarction is seen. Vascular: No vascular abnormality is noted on this unenhanced study. Skull: On bone window images, no calvarial abnormality is seen. Sinuses/Orbits: The paranasal sinuses appear clear. Other: None. IMPRESSION: Negative unenhanced CT of the brain. Electronically Signed   By: Dwyane Dee M.D.   On: 12/20/2016 12:17    Procedures Procedures (including critical care time)  Medications Ordered in ED Medications  dexamethasone (DECADRON) injection 10 mg (10 mg Intravenous Given 12/20/16 1227)  diphenhydrAMINE (BENADRYL) injection 25 mg (25 mg Intravenous Given 12/20/16 1227)  metoCLOPramide (REGLAN) injection 10 mg (10 mg Intravenous Given 12/20/16 1227)  sodium chloride 0.9 % bolus 1,000 mL (1,000 mLs Intravenous New Bag/Given 12/20/16 1227)     Initial Impression / Assessment and Plan / ED Course  I have reviewed the triage vital signs and the nursing notes.  Pertinent labs & imaging results that were available during my care of the patient were reviewed by me and considered in my medical decision making (see chart for details).     Pt HA treated and improved while in ED.  Presentation seems like complex migraine HA and non concerning for [facility], [facility], Meningitis, or  temporal arteritis. Symptoms have resolved completely with resolution of the headache. Her blood pressure has also normalized. Unclear whether BP was elevated due to headache or vice versa. Pt is afebrile with no focal neuro deficits, nuchal rigidity, or change in vision. Pt is to follow up with PCP to discuss prophylactic medication. Pt verbalizes understanding and is agreeable with plan to dc.    Final Clinical Impressions(s) / ED Diagnoses   Final diagnoses:  Acute nonintractable headache, unspecified headache type    New Prescriptions New Prescriptions   No medications on file     Roxy Horseman, PA-C 12/20/16 1350    Linwood Dibbles, MD 12/22/16 [phone]

## 2016-12-28 ENCOUNTER — Ambulatory Visit: Payer: Self-pay

## 2017-02-06 ENCOUNTER — Ambulatory Visit: Payer: Managed Care, Other (non HMO) | Admitting: Physician Assistant

## 2017-02-10 ENCOUNTER — Ambulatory Visit (INDEPENDENT_AMBULATORY_CARE_PROVIDER_SITE_OTHER): Payer: Managed Care, Other (non HMO) | Admitting: Physician Assistant

## 2017-02-10 ENCOUNTER — Encounter: Payer: Self-pay | Admitting: Physician Assistant

## 2017-02-10 VITALS — BP 127/83 | HR 80 | Wt 237.0 lb

## 2017-02-10 DIAGNOSIS — G44219 Episodic tension-type headache, not intractable: Secondary | ICD-10-CM | POA: Diagnosis not present

## 2017-02-10 DIAGNOSIS — N898 Other specified noninflammatory disorders of vagina: Secondary | ICD-10-CM | POA: Diagnosis not present

## 2017-02-10 DIAGNOSIS — Z7689 Persons encountering health services in other specified circumstances: Secondary | ICD-10-CM | POA: Diagnosis not present

## 2017-02-10 DIAGNOSIS — R102 Pelvic and perineal pain: Secondary | ICD-10-CM | POA: Diagnosis not present

## 2017-02-10 DIAGNOSIS — F332 Major depressive disorder, recurrent severe without psychotic features: Secondary | ICD-10-CM | POA: Insufficient documentation

## 2017-02-10 DIAGNOSIS — E559 Vitamin D deficiency, unspecified: Secondary | ICD-10-CM

## 2017-02-10 MED ORDER — VENLAFAXINE HCL ER 75 MG PO CP24: 75.0000 mg | ORAL_CAPSULE | Freq: Every day | ORAL | 1 refills | Status: DC

## 2017-02-10 NOTE — Progress Notes (Signed)
HPI:                                                                Connie Shea is a 21 y.o. female who [facility] today to establish care  Current Concerns include Nexplanon placement, headaches and depression  Depression: patient reports history of depressed mood throughout her teenage years, which she attributes to bullying. She reports when she was 16 she thought about taking pills and cutting her wrists, but she got scared. She did make one small cut on her left arm. She states she never told anyone about this. She states her depression became progressively worse after her pregnancy 3 years ago.  She has felt suicidal lately, but she does not have a plan and states she would not act on this. She lives at home with her daughter. She states her boyfriend and her mother are supportive. Denies symptoms of mania/hypomania. Denies auditory/visual hallucinations. Denies substance abuse. She has never been hospitalized or sought treatment for her depression.   Headaches: patient reports history of chronic headache ongoing for years. She states headaches are frontal and bitemporal. Describes them as throbbing and severe. Reports she had nosebleeds with the last 3 headaches. Patient reports she is having 1-2 headaches per week.    Vaginal odor: patient also reports a vaginal odor that has been persisting for months. She also endorses bilateral pelvic pain, especially after intercourse. She does state she has a history of STD's and acute PID. She is currently sexually active with 1 female partner. Patient states her Nexplanon was placed in April 2015 and she would like to have another one inserted.  Health Maintenance Health Maintenance  Topic Date Due  . TETANUS/TDAP  03/28/2015  . INFLUENZA VACCINE  05/10/2017  . CHLAMYDIA SCREENING  07/18/2017  . HIV Screening  Completed    Past Medical History:  Diagnosis Date  . Kidney infection    . Pre-eclampsia   . Sexually transmitted disease    Past Surgical History:  Procedure Laterality Date  . CESAREAN SECTION    . CESAREAN SECTION     Social History  Substance Use Topics  . Smoking status: Never Smoker  . Smokeless tobacco: Never Used  . Alcohol use Yes     Comment: 1 day a week   family history includes Asthma in her mother; Hypertension in her maternal aunt and mother.  ROS: negative except as noted in the HPI  Medications: Current Outpatient Prescriptions  Medication Sig Dispense Refill  . etonogestrel (IMPLANON) 68 MG IMPL implant 1 each by Subdermal route once.     No current facility-administered medications for this visit.    No Known Allergies     Objective:  BP 127/83   Pulse 80   Wt 237 lb (107.5 kg)   BMI 39.44 kg/m  Gen: well-groomed, cooperative, not ill-appearing, no distress HEENT: normal conjunctiva, TM's clear, oropharynx clear, moist mucus membranes, no thyromegaly or tenderness  Pulm: Normal work of breathing, normal phonation, clear to auscultation bilaterally CV: Normal rate, regular rhythm, s1 and s2 distinct, no murmurs, clicks or rubs, no carotid bruit GI: abdomen obese, soft, nondistended, nontender, no masses GU: vulva without rashes or lesions, normal introitus and urethral meatus,  vaginal mucosa without erythema, small amount of  Neuro: alert and oriented x 3, EOM's intact, PERRLA,, normal tone, no tremor MSK: moving all extremities, normal gait and station, no peripheral edema Skin: warm and dry, no rashes or lesions on exposed skin Psych: patient very tearful throughout visit, depressed mood, normal speech and thought content  A chaperone was used for the GU portion of the exam Olivia Mackie, CMA)  Depression screen [facility] 2/9 02/10/2017  Decreased Interest 3  Down, Depressed, Hopeless 3  PHQ - 2 Score 6  Altered sleeping 3  Tired, decreased energy 3  Change in appetite 3  Feeling bad or failure about yourself  3   Trouble concentrating 0  Moving slowly or fidgety/restless 0  Suicidal thoughts 3  PHQ-9 Score 21    No results found for this or any previous visit (from the past 72 hour(s)).  CLINICAL DATA:  Frontal and bitemporal headache for 2 weeks, some blurred vision and dizziness  EXAM: CT HEAD WITHOUT CONTRAST  TECHNIQUE: Contiguous axial images were obtained from the base of the skull through the vertex without intravenous contrast.  COMPARISON:  None.  FINDINGS: Brain: The ventricular system is normal in size and configuration and the septum is in a normal midline position. The fourth ventricle and basilar cisterns are unremarkable. No hemorrhage, mass lesion, or acute infarction is seen.  Vascular: No vascular abnormality is noted on this unenhanced study.  Skull: On bone window images, no calvarial abnormality is seen.  Sinuses/Orbits: The paranasal sinuses appear clear.  Other: None.  IMPRESSION: Negative unenhanced CT of the brain.   Electronically Signed   By: Dwyane Dee M.D.   On: 12/20/2016 12:17  Assessment and Plan: 21 y.o. female with   Encounter to establish care - CBC - Comprehensive metabolic panel  Severe episode of recurrent major depressive disorder, without psychotic features  - patient contracted for safety. Discussed option for inpatient care. Patient declines. I feel patient has the insight and judgment to make this decision. She would like to be at home with her daughter. She states she is able to take care of her daughter. Patient agreed to let her mother or boyfriend know if SI worsens and will go to nearest emergency room. Provided with [facility] crisis line - PHQ9 score of 21, starting Effexor at 75mg  for 1 week, then titrating to 150mg  - TSH - VITAMIN D 25 Hydroxy (Vit-D Deficiency, Fractures) - venlafaxine XR (EFFEXOR XR) 75 MG 24 hr capsule; Take 1 capsule (75 mg total) by mouth daily with breakfast.  Dispense: 30 capsule;  Refill: 1 - close follow-up in 2 weeks  Severe obesity (BMI >= 40) (HCC) - Hemoglobin A1c - Lipid Panel w/reflex Direct LDL  Episodic tension-type headache, not intractable - migraine is also on the differential - reviewed CT report from 12/2016, which was negative for acute intracranial abnormality - starting Effexor for depression, which will also act as migraine prophylaxis   Vaginal odor - SureSwab, T.vaginalis RNA,Ql,Female - GC/Chlamydia Probe Amp - if STI testing negative, will treat empirically for BV - metroNIDAZOLE (FLAGYL) 500 MG tablet; Take 1 tablet (500 mg total) by mouth 2 (two) times daily.  Dispense: 14 tablet; Refill: 0  Patient education and anticipatory guidance given Patient agrees with treatment plan Follow-up in 2 weeks or sooner as needed  Levonne Hubert PA-C

## 2017-02-10 NOTE — Patient Instructions (Addendum)
Go downstairs for labs today  For your depression: - Take Venlafaxine 1 capsule daily for 1 week, then increase to 2 capsules daily.  - If you experience any worsening suicidal ideations, let your mom and boyfriend know immediately and go to the nearest emergency room - [facility] Hot[facility] [phone] - [place] [facility] [place] 1-800-273-TALK  - return in 2 weeks  For your Nexplanon - Please schedule an appointment with Dr. Lyn HollingsheadAlexander for removal and insertion in the opposite arm   Major Depressive Disorder, Adult Major depressive disorder (MDD) is a mental health condition. It may also be called clinical depression or unipolar depression. MDD usually causes feelings of sadness, hopelessness, or helplessness. MDD can also cause physical symptoms. It can interfere with work, school, relationships, and other everyday activities. MDD may be mild, moderate, or severe. It may occur once (single episode major depressive disorder) or it may occur multiple times (recurrent major depressive disorder). What are the causes? The exact cause of this condition is not known. MDD is most likely caused by a combination of things, which may include:  Genetic factors. These are traits that are passed along from parent to child.  Individual factors. Your personality, your behavior, and the way you handle your thoughts and feelings may contribute to MDD. This includes personality traits and behaviors learned from others.  Physical factors, such as:  Differences in the part of your brain that controls emotion. This part of your brain may be different than it is in people who do not have MDD.  Long-term (chronic) medical or psychiatric illnesses.  Social factors. Traumatic experiences or major life changes may play a role in the development of MDD. What increases the risk? This condition is more likely to develop in women. The following factors may also make you more likely to develop MDD:  A family  history of depression.  Troubled family relationships.  Abnormally low levels of certain brain chemicals.  Traumatic events in childhood, especially abuse or the loss of a parent.  Being under a lot of stress, or long-term stress, especially from upsetting life experiences or losses.  A history of:  Chronic physical illness.  Other mental health disorders.  Substance abuse.  Poor living conditions.  Experiencing social exclusion or discrimination on a regular basis. What are the signs or symptoms? The main symptoms of MDD typically include:  Constant depressed or irritable mood.  Loss of interest in things and activities. MDD symptoms may also include:  Sleeping or eating too much or too little.  Unexplained weight change.  Fatigue or low energy.  Feelings of worthlessness or guilt.  Difficulty thinking clearly or making decisions.  Thoughts of suicide or of harming others.  Physical agitation or weakness.  Isolation. Severe cases of MDD may also occur with other symptoms, such as:  Delusions or hallucinations, in which you imagine things that are not real (psychotic depression).  Low-level depression that lasts at least a year (chronic depression or persistent depressive disorder).  Extreme sadness and hopelessness (melancholic depression).  Trouble speaking and moving (catatonic depression). How is this diagnosed? This condition may be diagnosed based on:  Your symptoms.  Your medical history, including your mental health history. This may involve tests to evaluate your mental health. You may be asked questions about your lifestyle, including any drug and alcohol use, and how long you have had symptoms of MDD.  A physical exam.  Blood tests to rule out other conditions. You must have a depressed mood  and at least four other MDD symptoms most of the day, nearly every day in the same 2-week timeframe before your health care provider can confirm a  diagnosis of MDD. How is this treated? This condition is usually treated by mental health professionals, such as psychologists, psychiatrists, and clinical social workers. You may need more than one type of treatment. Treatment may include:  Psychotherapy. This is also called talk therapy or counseling. Types of psychotherapy include:  Cognitive behavioral therapy (CBT). This type of therapy teaches you to recognize unhealthy feelings, thoughts, and behaviors, and replace them with positive thoughts and actions.  Interpersonal therapy (IPT). This helps you to improve the way you relate to and communicate with others.  Family therapy. This treatment includes members of your family.  Medicine to treat anxiety and depression, or to help you control certain emotions and behaviors.  Lifestyle changes, such as:  Limiting alcohol and drug use.  Exercising regularly.  Getting plenty of sleep.  Making healthy eating choices.  Spending more time outdoors. Treatments involving stimulation of the brain can be used in situations with extremely severe symptoms, or when medicine or other therapies do not work over time. These treatments include electroconvulsive therapy, transcranial magnetic stimulation, and vagal nerve stimulation. Follow these instructions at home: Activity   Return to your normal activities as told by your health care provider.  Exercise regularly and spend time outdoors as told by your health care provider. General instructions   Take over-the-counter and prescription medicines only as told by your health care provider.  Do not drink alcohol. If you drink alcohol, limit your alcohol intake to no more than 1 drink a day for nonpregnant women and 2 drinks a day for men. One drink equals 12 oz of beer, 5 oz of wine, or 1 oz of hard liquor. Alcohol can affect any antidepressant medicines you are taking. Talk to your health care provider about your alcohol use.  Eat a healthy  diet and get plenty of sleep.  Find activities that you enjoy doing, and make time to do them.  Consider joining a support group. Your health care provider may be able to recommend a support group.  Keep all follow-up visits as told by your health care provider. This is important. Where to find more information: [employer] on Mental Illness  www.nami.org [place] [employer] of Mental Health  http://www.maynard.net/ National Suicide Prevention Lifeline  1-800-273-TALK 724-054-8680). This is free, 24-hour help. Contact a health care provider if:  Your symptoms get worse.  You develop new symptoms. Get help right away if:  You self-harm.  You have serious thoughts about hurting yourself or others.  You see, hear, taste, smell, or feel things that are not present (hallucinate). This information is not intended to replace advice given to you by your health care provider. Make sure you discuss any questions you have with your health care provider. Document Released: 01/21/2013 Document Revised: 06/02/2016 Document Reviewed: 04/06/2016 Elsevier Interactive Patient Education  2017 [employer].

## 2017-02-11 LAB — LIPID PANEL W/REFLEX DIRECT LDL
CHOL/HDL RATIO: 3 ratio (ref <5.0)
Cholesterol: 151 mg/dL (ref <200)
HDL: 50 mg/dL — ABNORMAL LOW (ref >50)
LDL-CHOLESTEROL: 84 mg/dL
NON-HDL CHOLESTEROL (CALC): 101 mg/dL (ref <130)
Triglycerides: 79 mg/dL (ref <150)

## 2017-02-11 LAB — CBC
HEMATOCRIT: 36.9 % (ref 35.0–45.0)
HEMOGLOBIN: 11.7 g/dL (ref 11.7–15.5)
MCH: 27.3 pg (ref 27.0–33.0)
MCHC: 31.7 g/dL — ABNORMAL LOW (ref 32.0–36.0)
MCV: 86.2 fL (ref 80.0–100.0)
MPV: 10 fL (ref 7.5–12.5)
Platelets: 360 10*3/uL (ref 140–400)
RBC: 4.28 MIL/uL (ref 3.80–5.10)
RDW: 14.2 % (ref 11.0–15.0)
WBC: 10.2 10*3/uL (ref 3.8–10.8)

## 2017-02-11 LAB — COMPREHENSIVE METABOLIC PANEL
ALBUMIN: 4.1 g/dL (ref 3.6–5.1)
ALT: 16 U/L (ref 6–29)
AST: 15 U/L (ref 10–30)
Alkaline Phosphatase: 59 U/L (ref 33–115)
BILIRUBIN TOTAL: 0.5 mg/dL (ref 0.2–1.2)
BUN: 8 mg/dL (ref 7–25)
CALCIUM: 9.4 mg/dL (ref 8.6–10.2)
CHLORIDE: 102 mmol/L (ref 98–110)
CO2: 27 mmol/L (ref 20–31)
CREATININE: 0.56 mg/dL (ref 0.50–1.10)
Glucose, Bld: 76 mg/dL (ref 65–99)
Potassium: 4.1 mmol/L (ref 3.5–5.3)
SODIUM: 139 mmol/L (ref 135–146)
TOTAL PROTEIN: 6.8 g/dL (ref 6.1–8.1)

## 2017-02-11 LAB — HEMOGLOBIN A1C
Hgb A1c MFr Bld: 5.4 % (ref <5.7)
MEAN PLASMA GLUCOSE: 108 mg/dL

## 2017-02-11 LAB — VITAMIN D 25 HYDROXY (VIT D DEFICIENCY, FRACTURES): VIT D 25 HYDROXY: 11 ng/mL — AB (ref 30–100)

## 2017-02-11 LAB — TSH: TSH: 1.01 m[IU]/L

## 2017-02-13 ENCOUNTER — Encounter: Payer: Self-pay | Admitting: Physician Assistant

## 2017-02-13 DIAGNOSIS — R102 Pelvic and perineal pain: Secondary | ICD-10-CM | POA: Insufficient documentation

## 2017-02-13 DIAGNOSIS — E559 Vitamin D deficiency, unspecified: Secondary | ICD-10-CM | POA: Insufficient documentation

## 2017-02-13 HISTORY — DX: Pelvic and perineal pain: R10.2

## 2017-02-13 LAB — GC/CHLAMYDIA PROBE AMP
CT Probe RNA: NOT DETECTED
GC PROBE AMP APTIMA: NOT DETECTED

## 2017-02-13 LAB — SURESWAB, T.VAGINALIS RNA,QL,FEMALE: Trichomonas vaginalis RNA: NOT DETECTED

## 2017-02-13 MED ORDER — VITAMIN D (ERGOCALCIFEROL) 1.25 MG (50000 UNIT) PO CAPS: 50000.0000 [IU] | ORAL_CAPSULE | ORAL | 0 refills | Status: DC

## 2017-02-13 MED ORDER — METRONIDAZOLE 500 MG PO TABS: 500.0000 mg | ORAL_TABLET | Freq: Two times a day (BID) | ORAL | 0 refills | Status: AC

## 2017-02-24 ENCOUNTER — Ambulatory Visit: Payer: Managed Care, Other (non HMO) | Admitting: Osteopathic Medicine

## 2017-02-24 ENCOUNTER — Ambulatory Visit: Payer: Managed Care, Other (non HMO) | Admitting: Physician Assistant

## 2017-03-03 ENCOUNTER — Ambulatory Visit: Payer: Managed Care, Other (non HMO) | Admitting: Physician Assistant

## 2017-03-10 ENCOUNTER — Encounter: Payer: Self-pay | Admitting: Osteopathic Medicine

## 2017-03-10 ENCOUNTER — Ambulatory Visit (INDEPENDENT_AMBULATORY_CARE_PROVIDER_SITE_OTHER): Payer: Managed Care, Other (non HMO) | Admitting: Osteopathic Medicine

## 2017-03-10 VITALS — BP 136/74 | HR 86 | Ht 64.0 in | Wt 235.0 lb

## 2017-03-10 DIAGNOSIS — Z30017 Encounter for initial prescription of implantable subdermal contraceptive: Secondary | ICD-10-CM | POA: Diagnosis not present

## 2017-03-10 DIAGNOSIS — Z3046 Encounter for surveillance of implantable subdermal contraceptive: Secondary | ICD-10-CM

## 2017-03-10 NOTE — Patient Instructions (Signed)
Nexplanon removal: Keep wound clean. Keep dry for at least 24 hours, be sure to have sutures removed in 10-14 days. Please come see us right away if you develop redness, pain, foul drainage at the site where the device was removed/replaced, or if you develop fever or other concerns.

## 2017-03-10 NOTE — Progress Notes (Signed)
HPI: Connie Shea is a 21 y.o. female  who [facility] today, 03/10/17,  for chief complaint of:  Chief Complaint  Patient presents with  . Remove old Nexplanon and insert new device    Here for removal of old birth control implant and reinsertion of new.  No plans for pregnancy anytime in the near future, is experiencing some irregular bleeding on the Nexplanon but nothing that bothers her much.    Past medical history, surgical history, social history and family history reviewed.  Patient Active Problem List   Diagnosis Date Noted  . Pelvic pain 02/13/2017  . Vitamin D deficiency 02/13/2017  . Severe obesity (BMI >= 40) (HCC) 02/10/2017  . Episodic tension-type headache, not intractable 02/10/2017  . Severe episode of recurrent major depressive disorder, without psychotic features (HCC) 02/10/2017  . Acute pyelonephritis 07/19/2016  . Pyelonephritis 07/18/2016  . Hyponatremia 07/18/2016  . Elevated LFTs 07/18/2016  . Normocytic anemia 07/18/2016  . Hypokalemia 07/18/2016    Current medication list and allergy/intolerance information reviewed.   Current Outpatient Prescriptions on File Prior to Visit  Medication Sig Dispense Refill  . etonogestrel (IMPLANON) 68 MG IMPL implant 1 each by Subdermal route once.    . venlafaxine XR (EFFEXOR XR) 75 MG 24 hr capsule Take 1 capsule (75 mg total) by mouth daily with breakfast. 30 capsule 1  . Vitamin D, Ergocalciferol, (DRISDOL) 50000 units CAPS capsule Take 1 capsule (50,000 Units total) by mouth every 7 (seven) days. Take for 8 total doses(weeks) 8 capsule 0   No current facility-administered medications on file prior to visit.    No Known Allergies    Review of Systems:  Constitutional: No recent illness  Cardiac: No  chest pain, No  pressure  Respiratory:  No  shortness of breath.   Skin: No  Rash  Exam:  BP 136/74   Pulse 86   Ht 5\' 4"  (1.626 m)   Wt 235 lb (106.6  kg)   BMI 40.34 kg/m   Constitutional: VS see above. General Appearance: alert, well-developed, well-nourished, NAD  Neurological: Normal balance/coordination. No tremor.  Skin: warm, dry, intact.   Psychiatric: Normal judgment/insight. Normal mood and affect. Oriented x3.       ASSESSMENT/PLAN: The primary encounter diagnosis was Nexplanon insertion. A diagnosis of Nexplanon removal was also pertinent to this visit.  Nexplanon Removal Procedure Note PRE-OP DIAGNOSIS: Nexplanon, due to remove POST-OP DIAGNOSIS: Same  PROCEDURE: Nexplanon Removal  Performing Physician:Aerie Donica PROCEDURE:  Anesthesia: 0.5% Bupivicaine w/ epi 5 ml  Procedure: Consent obtained. A time-out was performed prior to initiating procedure to be sure of right patient and right location. The area surrounding the Nexplanon was prepared in the usual sterile manner. The site was anesthetized. A skin incision was made over the distal aspect of the device. The capsule lysed sharply and the device removed using a hemostat. Hemostasis was assured. See below for insertion documentation and closure. The patient tolerated the procedure well.  Followup: The patient tolerated the procedure well without complications. Standard post-procedure care is explained and return precautions are given.    NEXPLANON INSERTION PRE-OP DIAGNOSIS: desired long-term, reversible contraception  POST-OP DIAGNOSIS: Same  PROCEDURE: Nexplanon  placement Performing Physician: Dr. Sunnie NielsenNatalie Harvard [employer]iss  Risks and benefits reviewed with the patient. Informed consent obtained, patient opts to proceed today.    PROCEDURE:  Site (check): left arm  Lot # [id number] Sterile Preparation: Chlorhexidine   Insertion site was selected 8 - 10  cm from medial epicondyle and marked along with guiding site using sterile marker  Procedure area was already prepped and draped in a sterile fashion. 5 mL 0.5% Bupivicaine w/ epinephrine. Anesthesia confirmed.  Nexplanon  trocar was inserted subcutaneously and then Nexplanon  capsule delivered subcutaneously Trocar was removed from the insertion site. Nexplanon  capsule was palpated by provider and patient to assure satisfactory placement. Estimated blood loss <1 mL Dressings applied: 4.0 prolene simple interrupted suture x2 and small pressure bandage Followup: The patient tolerated the procedure well without complications.  Standard post-procedure care is explained and return precautions are given.    Patient Instructions  Nexplanon removal: Keep wound clean. Keep dry for at least 24 hours, be sure to have sutures removed in 10-14 days. Please come see [place] right away if you develop redness, pain, foul drainage at the site where the device was removed/replaced, or if you develop fever or other concerns.     Follow-up plan: Return for suture removal 10-14 days.  Visit summary with medication list and pertinent instructions was printed for patient to review, alert [place] if any changes needed. All questions at time of visit were answered - patient instructed to contact office with any additional concerns. ER/RTC precautions were reviewed with the patient and understanding verbalized.   Note: Total time spent 15 minutes, greater than 50% of the visit was spent face-to-face counseling and coordinating care for the following: The primary encounter diagnosis was Nexplanon insertion. A diagnosis of Nexplanon removal was also pertinent to this visit.Marland Kitchen

## 2017-03-14 ENCOUNTER — Ambulatory Visit (INDEPENDENT_AMBULATORY_CARE_PROVIDER_SITE_OTHER): Payer: Managed Care, Other (non HMO) | Admitting: Physician Assistant

## 2017-03-14 VITALS — BP 101/75 | HR 88 | Wt 239.0 lb

## 2017-03-14 DIAGNOSIS — F331 Major depressive disorder, recurrent, moderate: Secondary | ICD-10-CM | POA: Diagnosis not present

## 2017-03-14 DIAGNOSIS — O2600 Excessive weight gain in pregnancy, unspecified trimester: Secondary | ICD-10-CM | POA: Diagnosis not present

## 2017-03-14 MED ORDER — PHENTERMINE HCL 37.5 MG PO TABS: ORAL_TABLET | ORAL | 0 refills | Status: DC

## 2017-03-14 MED ORDER — VENLAFAXINE HCL ER 150 MG PO CP24: 150.0000 mg | ORAL_CAPSULE | Freq: Every day | ORAL | 3 refills | Status: DC

## 2017-03-14 NOTE — Patient Instructions (Addendum)
For weight: - Take Phentermine first thing in the morning. Alternatively, you can break the tablet in half and take 1/2 in the morning 1/2 in the early afternoon (before 2pm to prevent sleep disturbance) - Try to limit calories to 1800 per day - Increase physical activity to at least 3, 30-minute sessions of moderate-vigorous exercise per week  For mood: - Increase Venlafaxine to 150mg  every morning (the earlier you can take it in the day, the better) - Practice good sleep hygeine - [facility] - Please call if you are having insomnia - Follow-up in 4 weeks  Sleep Hygiene . Limiting daytime naps to 30 minutes . Napping does not make up for inadequate nighttime sleep. However, a short nap of 20-30 minutes can help to improve mood, alertness and performance.  . Avoiding stimulants such as  caffeine and nicotine close to bedtime.  And when it comes to alcohol, moderation is key 4. While alcohol is well-known to help you fall asleep faster, too much close to bedtime can disrupt sleep in the second half of the night as the body begins to process the alcohol.    . Exercising to promote good quality sleep.  As little as 10 minutes of aerobic exercise, such as walking or cycling, can drastically improve nighttime sleep quality.  For the best night's sleep, most people should avoid strenuous workouts close to bedtime. However, the effect of intense nighttime exercise on sleep differs from person to person, so find out what works best for you.   . Steering clear of food that can be disruptive right before sleep.   Heavy or rich foods, fatty or fried meals, spicy dishes, citrus fruits, and carbonated drinks can trigger indigestion for some people. When this occurs close to bedtime, it can lead to painful heartburn that disrupts sleep. . Ensuring adequate exposure to natural light.  This is particularly important for individuals who may not venture outside frequently. Exposure  to sunlight during the day, as well as darkness at night, helps to maintain a healthy sleep-wake cycle . Marland Kitchen. Establishing a regular relaxing bedtime routine.  A regular nightly routine helps the body recognize that it is bedtime. This could include taking warm shower or bath, reading a book, or light stretches. When possible, try to avoid emotionally upsetting conversations and activities before attempting to sleep. . Making sure that the sleep environment is pleasant.  Mattress and pillows should be comfortable. The bedroom should be cool - between 60 and 67 degrees - for optimal sleep. Bright light from lamps, cell phone and TV screens can make it difficult to fall asleep4, so turn those light off or adjust them when possible. Consider using blackout curtains, eye shades, ear plugs, "white noise" machines, humidifiers, fans and other devices that can make the bedroom more relaxing.

## 2017-03-14 NOTE — Progress Notes (Signed)
HPI:                                                                Connie Shea is a 21 y.o. female who presents to [facility]: Primary Care Sports Medicine today for depression follow-up  Depression       The patient presents with depression.  This is a recurrent problem.  The current episode started more than 1 month ago.   The onset quality is gradual.   The problem occurs daily.  The problem has been gradually improving since onset.  Associated symptoms include fatigue, insomnia, decreased interest, sad and suicidal ideas.  Past treatments include SNRIs - Serotonin and norepinephrine reuptake inhibitors.  Compliance with treatment is variable.  Past compliance problems include medication issues (Venlafaxine causing insomnia).  Risk factors include stress and the patient not taking medications correctly (Taking Venlafaxine every other day/skipping days).   Past medical history includes depression.     Pertinent negatives include no suicide attempts.  Patient is requesting weight loss medication today. She states she is "sad about my weight" and feels this is contributing to her depression. She states she is motivated to lose weight, but needs help. Patient has a history of obesity of childhood, but has struggled to lose weight since last her pregnancy  Past Medical History:  Diagnosis Date  . Acute pyelonephritis 07/19/2016  . Anemia   . Depression   . Genital herpes   . History of acute PID   . Kidney infection   . Obesity   . Pelvic pain 02/13/2017  . Pre-eclampsia   . Sexually transmitted disease   . Sickle cell trait [facility](HCC)    Past Surgical History:  Procedure Laterality Date  . CESAREAN SECTION    . CESAREAN SECTION     Social History  Substance Use Topics  . Smoking status: Never Smoker  . Smokeless tobacco: Never Used  . Alcohol use Yes     Comment: 1 day a week   family history includes Asthma in her mother; Hypertension in her maternal aunt and  mother.  ROS: negative except as noted in the HPI  Medications: Current Outpatient Prescriptions  Medication Sig Dispense Refill  . etonogestrel (IMPLANON) 68 MG IMPL implant 1 each by Subdermal route once.    . Vitamin D, Ergocalciferol, (DRISDOL) 50000 units CAPS capsule Take 1 capsule (50,000 Units total) by mouth every 7 (seven) days. Take for 8 total doses(weeks) 8 capsule 0  . phentermine (ADIPEX-P) 37.5 MG tablet One tab by mouth qAM 30 tablet 0  . venlafaxine XR (EFFEXOR-XR) 150 MG 24 hr capsule Take 1 capsule (150 mg total) by mouth daily with breakfast. 30 capsule 3   No current facility-administered medications for this visit.    No Known Allergies     Objective:  BP 101/75   Pulse 88   Wt 239 lb (108.4 kg)   BMI 41.02 kg/m  Gen: not ill-appearing, no distress Pulm: Normal work of breathing, normal phonation, clear to auscultation bilaterally CV: Normal rate, regular rhythm, s1 and s2 distinct, no murmurs, clicks or rubs  Neuro: alert and oriented x 3, EOM's intact, no tremor MSK: moving all extremities, normal gait and station, no peripheral edema Psych: well-groomed, cooperative,  good  eye contact, mood "sad," tearful, affect mood-congruent, normal speech and thought content   Depression screen [facility] 2/9 03/14/2017 02/10/2017  Decreased Interest 2 3  Down, Depressed, Hopeless 2 3  PHQ - 2 Score 4 6  Altered sleeping 3 3  Tired, decreased energy 3 3  Change in appetite 3 3  Feeling bad or failure about yourself  3 3  Trouble concentrating 0 0  Moving slowly or fidgety/restless 0 0  Suicidal thoughts 2 3  PHQ-9 Score 18 21   GAD 7 : Generalized Anxiety Score 03/14/2017  Nervous, Anxious, on Edge 0  Control/stop worrying 3  Worry too much - different things 3  Trouble relaxing 2  Restless 2  Easily annoyed or irritable 3  Afraid - awful might happen 0  Total GAD 7 Score 13     No results found for this or any previous visit (from the past 72 hour(s)). No  results found.    Assessment and Plan: 21 y.o. female with  1. Moderate episode of recurrent major depressive disorder, without psychotic features (HCC) - PHQ9 score 18 (moderately severe), improved from 21 since starting Effexor - Increasing Effexor to 150 mg daily. Discussed with patient importance of taking medication daily to avoid withdrawal and ensure efficacy. Educated to take first thing in the morning to minimize sleep disturbance. - patient contracted for safety - Educated on sleep hygiene  - venlafaxine XR (EFFEXOR-XR) 150 MG 24 hr capsule; Take 1 capsule (150 mg total) by mouth daily with breakfast.  Dispense: 30 capsule; Refill: 3 - follow-up in 4 weeks  2. Severe obesity (BMI >= 40), Abnormal weight gain during pregnancy, antepartum - TSH, renal function, liver enzymes, Hgb within normal limits - plan to start 3 months of Phentermine supplemented with therapeutic lifestyle changes - patient educated that this is a controlled substance for short-term use. Patient understands she must return for monthly weight checks and refills.  - Will monitor blood pressure and heart rate - discussed that stimulant may worsen sleep disturbance. Patient agrees to discontinue Phentermine if insomnia occurs - phentermine (ADIPEX-P) 37.5 MG tablet; One tab by mouth qAM  Dispense: 30 tablet; Refill: 0  Patient education and anticipatory guidance given Patient agrees with treatment plan Follow-up in 4 weeks for weight check as needed if symptoms worsen or fail to improve  Levonne Hubert PA-C

## 2017-03-16 ENCOUNTER — Encounter: Payer: Self-pay | Admitting: Physician Assistant

## 2017-03-16 DIAGNOSIS — O2600 Excessive weight gain in pregnancy, unspecified trimester: Secondary | ICD-10-CM | POA: Insufficient documentation

## 2017-03-16 DIAGNOSIS — Z975 Presence of (intrauterine) contraceptive device: Secondary | ICD-10-CM | POA: Insufficient documentation

## 2017-03-17 ENCOUNTER — Telehealth: Payer: Self-pay | Admitting: *Deleted

## 2017-03-17 NOTE — Telephone Encounter (Signed)
Pre Authorization sent to cover my meds. [id number]

## 2017-03-20 ENCOUNTER — Ambulatory Visit: Payer: Managed Care, Other (non HMO) | Admitting: Osteopathic Medicine

## 2017-03-21 ENCOUNTER — Emergency Department (HOSPITAL_COMMUNITY)
Admission: EM | Admit: 2017-03-21 | Discharge: 2017-03-23 | Disposition: A | Discharge status: Home / Self Care | Payer: Managed Care, Other (non HMO) | Attending: Emergency Medicine | Admitting: Emergency Medicine

## 2017-03-21 ENCOUNTER — Encounter: Payer: Self-pay | Admitting: Physician Assistant

## 2017-03-21 ENCOUNTER — Ambulatory Visit (INDEPENDENT_AMBULATORY_CARE_PROVIDER_SITE_OTHER): Payer: Managed Care, Other (non HMO) | Admitting: Physician Assistant

## 2017-03-21 VITALS — BP 78/48 | HR 147 | Wt 228.0 lb

## 2017-03-21 DIAGNOSIS — F1994 Other psychoactive substance use, unspecified with psychoactive substance-induced mood disorder: Secondary | ICD-10-CM | POA: Diagnosis not present

## 2017-03-21 DIAGNOSIS — F8081 Childhood onset fluency disorder: Secondary | ICD-10-CM | POA: Diagnosis not present

## 2017-03-21 DIAGNOSIS — F39 Unspecified mood [affective] disorder: Secondary | ICD-10-CM | POA: Diagnosis not present

## 2017-03-21 DIAGNOSIS — R000 Tachycardia, unspecified: Secondary | ICD-10-CM | POA: Diagnosis not present

## 2017-03-21 DIAGNOSIS — R45851 Suicidal ideations: Secondary | ICD-10-CM

## 2017-03-21 DIAGNOSIS — F419 Anxiety disorder, unspecified: Secondary | ICD-10-CM | POA: Diagnosis not present

## 2017-03-21 DIAGNOSIS — T50905A Adverse effect of unspecified drugs, medicaments and biological substances, initial encounter: Secondary | ICD-10-CM

## 2017-03-21 DIAGNOSIS — T505X5A Adverse effect of appetite depressants, initial encounter: Secondary | ICD-10-CM | POA: Insufficient documentation

## 2017-03-21 DIAGNOSIS — T887XXA Unspecified adverse effect of drug or medicament, initial encounter: Secondary | ICD-10-CM | POA: Diagnosis not present

## 2017-03-21 DIAGNOSIS — F1220 Cannabis dependence, uncomplicated: Secondary | ICD-10-CM | POA: Diagnosis not present

## 2017-03-21 DIAGNOSIS — F333 Major depressive disorder, recurrent, severe with psychotic symptoms: Secondary | ICD-10-CM | POA: Diagnosis not present

## 2017-03-21 DIAGNOSIS — F332 Major depressive disorder, recurrent severe without psychotic features: Secondary | ICD-10-CM | POA: Diagnosis present

## 2017-03-21 DIAGNOSIS — F3289 Other specified depressive episodes: Secondary | ICD-10-CM | POA: Diagnosis not present

## 2017-03-21 LAB — COMPREHENSIVE METABOLIC PANEL
ALT: 19 U/L (ref 14–54)
AST: 19 U/L (ref 15–41)
Albumin: 4.8 g/dL (ref 3.5–5.0)
Alkaline Phosphatase: 74 U/L (ref 38–126)
Anion gap: 10 (ref 5–15)
BILIRUBIN TOTAL: 0.8 mg/dL (ref 0.3–1.2)
BUN: 11 mg/dL (ref 6–20)
CO2: 26 mmol/L (ref 22–32)
CREATININE: 0.78 mg/dL (ref 0.44–1.00)
Calcium: 9.5 mg/dL (ref 8.9–10.3)
Chloride: 103 mmol/L (ref 101–111)
Glucose, Bld: 90 mg/dL (ref 65–99)
Potassium: 3.8 mmol/L (ref 3.5–5.1)
Sodium: 139 mmol/L (ref 135–145)
TOTAL PROTEIN: 8.4 g/dL — AB (ref 6.5–8.1)

## 2017-03-21 LAB — RAPID URINE DRUG SCREEN, HOSP PERFORMED
AMPHETAMINES: NOT DETECTED
Barbiturates: NOT DETECTED
Benzodiazepines: NOT DETECTED
Cocaine: NOT DETECTED
OPIATES: NOT DETECTED
Tetrahydrocannabinol: POSITIVE — AB

## 2017-03-21 LAB — CBC
HCT: 37.7 % (ref 36.0–46.0)
Hemoglobin: 13.1 g/dL (ref 12.0–15.0)
MCH: 28.5 pg (ref 26.0–34.0)
MCHC: 34.7 g/dL (ref 30.0–36.0)
MCV: 82.1 fL (ref 78.0–100.0)
PLATELETS: 398 10*3/uL (ref 150–400)
RBC: 4.59 MIL/uL (ref 3.87–5.11)
RDW: 13.8 % (ref 11.5–15.5)
WBC: 10.6 10*3/uL — AB (ref 4.0–10.5)

## 2017-03-21 LAB — SALICYLATE LEVEL: Salicylate Lvl: <7 mg/dL (ref 2.8–30.0)

## 2017-03-21 LAB — ACETAMINOPHEN LEVEL: Acetaminophen (Tylenol), Serum: <10 ug/mL — ABNORMAL LOW (ref 10–30)

## 2017-03-21 LAB — ETHANOL

## 2017-03-21 MED ORDER — VENLAFAXINE HCL ER 75 MG PO CP24
150.0000 mg | ORAL_CAPSULE | Freq: Every day | ORAL | Status: DC
  Administered 2017-03-22: 150 mg via ORAL
  Filled 2017-03-21: qty 2

## 2017-03-21 MED ORDER — LORAZEPAM 1 MG PO TABS
1.0000 mg | ORAL_TABLET | Freq: Once | ORAL | Status: AC
  Administered 2017-03-21: 1 mg via ORAL
  Filled 2017-03-21: qty 1

## 2017-03-21 MED ORDER — LORAZEPAM 1 MG PO TABS
1.0000 mg | ORAL_TABLET | Freq: Once | ORAL | Status: AC
  Administered 2017-03-22: 1 mg via ORAL
  Filled 2017-03-21: qty 1
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
## 2017-03-21 NOTE — Progress Notes (Signed)
HPI:                                                                Connie Shea is a 21 y.o. female who presents to [name]: Primary Care Sports Medicine today for depression follow-up  Patient is accompanied by her partner, Connie Shea, today.  Patient with PMH recurrent MDD presents today complaining of auditory hallucinations, worsening SI and stuttering that began yesterday. Patient states she has not been sleeping for multiple days. She reports she heard someone say her name multiple times yesterday, but no one else was home. She states she then retrieved a kitchen knife and began cutting her left wrist. She states the knife was too dull so she also attempted to cut herself with scissors and a razor blade.   Patient has been taking Phentermine for abnormal weight gain/morbid obesity for approximately 1 week. She reports last dose of Phentermine was this morning.   She denies fever, chills, neck stiffness, headache, vision change, focal weakness, chest pain, or shortness of breath.  Past Medical History:  Diagnosis Date  . Acute pyelonephritis 07/19/2016  . Anemia   . Depression   . Genital herpes   . History of acute PID   . Kidney infection   . Obesity   . Pelvic pain 02/13/2017  . Pre-eclampsia   . Sexually transmitted disease   . Sickle cell trait [facility])    Past Surgical History:  Procedure Laterality Date  . CESAREAN SECTION    . CESAREAN SECTION     Social History  Substance Use Topics  . Smoking status: Never Smoker  . Smokeless tobacco: Never Used  . Alcohol use Yes     Comment: 1 day a week   family history includes Asthma in her mother; Hypertension in her maternal aunt and mother.  ROS: negative except as noted in the HPI  Medications: Current Outpatient Prescriptions  Medication Sig Dispense Refill  . etonogestrel (IMPLANON) 68 MG IMPL implant 1 each by Subdermal route once.    . venlafaxine XR (EFFEXOR-XR) 150 MG 24 hr capsule Take  1 capsule (150 mg total) by mouth daily with breakfast. 30 capsule 3  . Vitamin D, Ergocalciferol, (DRISDOL) 50000 units CAPS capsule Take 1 capsule (50,000 Units total) by mouth every 7 (seven) days. Take for 8 total doses(weeks) 8 capsule 0   No current facility-administered medications for this visit.    No Known Allergies     Objective:  BP (!) 78/48   Pulse (!) 147   Wt 228 lb (103.4 kg)   SpO2 98%   BMI 39.14 kg/m  Gen: well-groomed, not ill-appearing, no acute distress HEENT: head normocephalic, atraumatic, face symmetric; normal conjunctiva, wearing contact lenses,  oropharynx clear, moist mucus membranes Pulm: Normal work of breathing, normal phonation, clear to auscultation bilaterally CV: Tachycardic, regular rhythm, s1 and s2 distinct, no murmurs, clicks or rubs Neuro:  cranial nerves II-XII intact, no nystagmus, no papilledema, normal finger-to-nose, normal heel-to-shin, negative pronator drift, normal coordination, DTR's intact, normal tone, no tremor MSK: strength 5/5 and symmetric in bilateral upper and lower extremities, normal gait and station Mental Status: alert and oriented x 3, appearance casual, cooperative, speech - stuttering, no psychomotor agitation, tearful Skin: warm, dry, left wrist with  multiple linear superficial abrasions  No results found for this or any previous visit (from the past 72 hour(s)). No results found.  ECG 03/21/2017 Vent Rate 97 bpm PR-I 132 QRS 80 ms QTc 464 ms Normal sinus rhythm Nonspecific T wave abnormality  Assessment and Plan: 21 y.o. female with   1. Severe episode of recurrent major depressive disorder, with psychotic features [facility](HCC) - discussed with patient that I felt it necessary to ensure her safety and that I recommend she go to the hospital for a psychiatric evaluation and likely admission. Patient was cooperative. - called Behavioral [facility] Crisis Line and spoke to intake specialists. They recommended patient  present to [facility] ED - patient will be transported by her partner by personal vehicle. Instructed to proceed directly from office to the ED - called [facility] ED and gave report to triage RN  2. Tachycardia - reviewed EKG 12-Lead showing normal sinus rhythm, rate 97   3. Medication reaction, initial encounter - discontinuing Phentermine. Patient brought pill bottles and medication was disposed of in office. Added to allergy list  4. Suicidal ideations - patient transferred by personal vehicle to ED for further management as above  5. Stammering and stuttering - reassuring neurologic exam. No focal deficits.  Patient education and anticipatory guidance given Patient agrees with treatment plan  Levonne Hubertharley E. Cummings PA-C

## 2017-03-21 NOTE — ED Notes (Signed)
Patient still unable to give urine sample.  Container at bedside.

## 2017-03-21 NOTE — ED Triage Notes (Signed)
Patient arrives from home with complaints of speech difficulty, nervousness, and thoughts of suicide since starting Phentermine with her regular antidepressants effexor.  Effexor dose was increased about a month ago and she started taking the phentermine a week ago.  She also has self inflicted superficial scratches to left wrist area.

## 2017-03-21 NOTE — ED Provider Notes (Signed)
[facility]-EMERGENCY DEPT Provider Note   CSN: [id number] Arrival date & time: 03/21/17  1511     History   Chief Complaint Chief Complaint  Patient presents with  . Medication Reaction    HPI Connie Shea is a 21 y.o. female.  Patient is a 21 year old female with a history of depression who presents with suicidal ideations. She states that she takes Effexor and her Effexor dose was recently increased about a month ago. She also started taking phentermine for weight loss about a week ago. She's been taking 1 pill a day. She states since that time she hasn't been sleeping well. She doesn't have much of an appetite so she hasn't been eating well. She's been feeling very jittery. Last night she was hearing people calling her name and she went to investigate and grabbed a knife and started cutting her arm. She also took some scissors and started cutting her arm. She was having thoughts of getting a razor blade and killing herself. She denies any other drug use. No alcohol use. She denies any other ingestions. No other recent illnesses. She does note that today she started noting that she's having a stuttering of her voice.      Past Medical History:  Diagnosis Date  . Acute pyelonephritis 07/19/2016  . Anemia   . Depression   . Genital herpes   . History of acute PID   . Kidney infection   . Obesity   . Pelvic pain 02/13/2017  . Pre-eclampsia   . Sexually transmitted disease   . Sickle cell trait [facility](HCC)     Patient Active Problem List   Diagnosis Date Noted  . Suicidal ideations 03/21/2017  . Stammering and stuttering 03/21/2017  . Severe episode of recurrent major depressive disorder, with psychotic features (HCC) 03/21/2017  . Tachycardia 03/21/2017  . Abnormal weight gain during pregnancy, antepartum 03/16/2017  . Nexplanon in place 03/16/2017  . Pelvic pain 02/13/2017  . Vitamin D deficiency 02/13/2017  . Severe obesity (BMI >= 40) (HCC) 02/10/2017  . Episodic tension-type  headache, not intractable 02/10/2017  . Severe episode of recurrent major depressive disorder, without psychotic features (HCC) 02/10/2017  . Elevated LFTs 07/18/2016  . History of normocytic normochromic anemia 07/18/2016    Past Surgical History:  Procedure Laterality Date  . CESAREAN SECTION    . CESAREAN SECTION      OB History    No data available       Home Medications    Prior to Admission medications   Medication Sig Start Date End Date Taking? Authorizing Provider  etonogestrel (IMPLANON) 68 MG IMPL implant 1 each by Subdermal route once.   Yes [provider]  venlafaxine XR (EFFEXOR-XR) 150 MG 24 hr capsule Take 1 capsule (150 mg total) by mouth daily with breakfast. 03/14/17  Yes Carlis Stableummings, Charley Elizabeth, PA-C  Vitamin D, Ergocalciferol, (DRISDOL) 50000 units CAPS capsule Take 1 capsule (50,000 Units total) by mouth every 7 (seven) days. Take for 8 total doses(weeks) 02/13/17  Yes Carlis Stableummings, Charley Elizabeth, PA-C    Family History Family History  Problem Relation Age of Onset  . Hypertension Mother   . Asthma Mother   . Hypertension Maternal Aunt     Social History Social History  Substance Use Topics  . Smoking status: Never Smoker  . Smokeless tobacco: Never Used  . Alcohol use Yes     Comment: 1 day a week     Allergies   Phentermine hcl   Review of  Systems Review of Systems  Constitutional: Negative for chills, diaphoresis, fatigue and fever.  HENT: Negative for congestion, rhinorrhea and sneezing.   Eyes: Negative.   Respiratory: Negative for cough, chest tightness and shortness of breath.   Cardiovascular: Negative for chest pain and leg swelling.  Gastrointestinal: Negative for abdominal pain, blood in stool, diarrhea, nausea and vomiting.  Genitourinary: Negative for difficulty urinating, flank pain, frequency and hematuria.  Musculoskeletal: Negative for arthralgias and back pain.  Skin: Negative for rash.  Neurological:  Negative for dizziness, speech difficulty, weakness, numbness and headaches.  Psychiatric/Behavioral: Positive for sleep disturbance and suicidal ideas. The patient is nervous/anxious and is hyperactive.      Physical Exam Updated Vital Signs BP 135/87 (BP Location: Right Arm)   Pulse 93   Temp 98.5 F (36.9 C) (Oral)   Resp 16   SpO2 100%   Physical Exam  Constitutional: She is oriented to person, place, and time. She appears well-developed and well-nourished.  HENT:  Head: Normocephalic and atraumatic.  Eyes: Pupils are equal, round, and reactive to light.  Neck: Normal range of motion. Neck supple.  Cardiovascular: Normal rate, regular rhythm and normal heart sounds.   Pulmonary/Chest: Effort normal and breath sounds normal. No respiratory distress. She has no wheezes. She has no rales. She exhibits no tenderness.  Abdominal: Soft. Bowel sounds are normal. There is no tenderness. There is no rebound and no guarding.  Musculoskeletal: Normal range of motion. She exhibits no edema.  Lymphadenopathy:    She has no cervical adenopathy.  Neurological: She is alert and oriented to person, place, and time.  Motor 5/5 all extremities Sensation grossly intact to LT all extremities Finger to Nose intact, no pronator drift CN II-XII grossly intact Gait normal  She has a stutter present, but can speak normal sentences   Skin: Skin is warm and dry. No rash noted.  Superficial lacerations to the volar surface of the left wrist. They do not appear to need suturing.  Psychiatric: She has a normal mood and affect.     ED Treatments / Results  Labs (all labs ordered are listed, but only abnormal results are displayed) Labs Reviewed  COMPREHENSIVE METABOLIC PANEL - Abnormal; Notable for the following:       Result Value   Total Protein 8.4 (*)    All other components within normal limits  ACETAMINOPHEN LEVEL - Abnormal; Notable for the following:    Acetaminophen (Tylenol), Serum  <10 (*)    All other components within normal limits  CBC - Abnormal; Notable for the following:    WBC 10.6 (*)    All other components within normal limits  RAPID URINE DRUG SCREEN, HOSP PERFORMED - Abnormal; Notable for the following:    Tetrahydrocannabinol POSITIVE (*)    All other components within normal limits  ETHANOL  SALICYLATE LEVEL    EKG  EKG Interpretation None       Radiology No results found.  Procedures Procedures (including critical care time)  Medications Ordered in ED Medications  venlafaxine XR (EFFEXOR-XR) 24 hr capsule 150 mg (not administered)  LORazepam (ATIVAN) tablet 1 mg (not administered)  LORazepam (ATIVAN) tablet 1 mg (1 mg Oral Given 03/21/17 1649)  LORazepam (ATIVAN) tablet 1 mg (1 mg Oral Given 03/21/17 2051)     Initial Impression / Assessment and Plan / ED Course  I have reviewed the triage vital signs and the nursing notes.  Pertinent labs & imaging results that were available during my care  of the patient were reviewed by me and considered in my medical decision making (see chart for details).     Patient presents with suicidal ideations and cutting behavior. She also seems to be having a reaction to the phentermine that she's been ingesting. She was tachycardic on arrival although this has resolved. She's very anxious and hasn't been sleeping. This has improved with some Ativan in the ED. She also has a stuttering of her speech. However she has no slurred speech. No facial drooping or other neurologic deficits. She is ambulating without ataxia. I feel this is likely a medication reaction rather than a central issue such as a CVA. She has been medically cleared and TTS has evaluated the patient and recommends inpatient treatment.  Final Clinical Impressions(s) / ED Diagnoses   Final diagnoses:  Adverse effect of drug, initial encounter  Suicidal ideation    New Prescriptions New Prescriptions   No medications on file       Rolan Bucco, MD 03/21/17 2358

## 2017-03-21 NOTE — ED Notes (Signed)
TTS at bedside. 

## 2017-03-21 NOTE — Patient Instructions (Signed)
Please go to [facility] directly from here and let them know your PCP sent you for a psychiatric evaluation  [address], [place]  Plan to discontinue Phentermine (throw remaining medication away) and follow-up with me after you are discharged from the hospital

## 2017-03-22 DIAGNOSIS — F1994 Other psychoactive substance use, unspecified with psychoactive substance-induced mood disorder: Secondary | ICD-10-CM

## 2017-03-22 DIAGNOSIS — F39 Unspecified mood [affective] disorder: Secondary | ICD-10-CM

## 2017-03-22 DIAGNOSIS — R000 Tachycardia, unspecified: Secondary | ICD-10-CM

## 2017-03-22 DIAGNOSIS — G4700 Insomnia, unspecified: Secondary | ICD-10-CM

## 2017-03-22 DIAGNOSIS — F3289 Other specified depressive episodes: Secondary | ICD-10-CM

## 2017-03-22 DIAGNOSIS — F8081 Childhood onset fluency disorder: Secondary | ICD-10-CM

## 2017-03-22 MED ORDER — VENLAFAXINE HCL ER 75 MG PO CP24
75.0000 mg | ORAL_CAPSULE | Freq: Every day | ORAL | Status: DC
  Administered 2017-03-23: 75 mg via ORAL
  Filled 2017-03-22: qty 1

## 2017-03-22 MED ORDER — TRAZODONE HCL 100 MG PO TABS
100.0000 mg | ORAL_TABLET | Freq: Every day | ORAL | Status: DC
  Administered 2017-03-22: 100 mg via ORAL
  Filled 2017-03-22: qty 1

## 2017-03-22 MED ORDER — VITAMIN D (ERGOCALCIFEROL) 1.25 MG (50000 UNIT) PO CAPS
50000.0000 [IU] | ORAL_CAPSULE | ORAL | Status: DC
  Administered 2017-03-23: 50000 [IU] via ORAL
  Filled 2017-03-22: qty 1

## 2017-03-22 MED ORDER — BUPROPION HCL ER (XL) 150 MG PO TB24
150.0000 mg | ORAL_TABLET | Freq: Every day | ORAL | Status: DC
  Administered 2017-03-23: 150 mg via ORAL
  Filled 2017-03-22: qty 1

## 2017-03-22 MED ORDER — LORAZEPAM 0.5 MG PO TABS
0.5000 mg | ORAL_TABLET | Freq: Three times a day (TID) | ORAL | Status: DC
  Administered 2017-03-22 – 2017-03-23 (×4): 0.5 mg via ORAL
  Filled 2017-03-22 (×4): qty 1

## 2017-03-22 MED ORDER — LAMOTRIGINE 25 MG PO TABS
25.0000 mg | ORAL_TABLET | Freq: Every day | ORAL | Status: DC
  Administered 2017-03-22 – 2017-03-23 (×2): 25 mg via ORAL
  Filled 2017-03-22 (×2): qty 1

## 2017-03-22 MED ORDER — TRAZODONE HCL 50 MG PO TABS: 50.0000 mg | ORAL_TABLET | Freq: Every evening | ORAL | Status: DC | PRN

## 2017-03-22 NOTE — ED Notes (Signed)
Pt on unit, awake and cooperative.  Pt using phone and speaking to relative and is in pleasant mood.  Pt currently denies pain or discomfort.  Pt denies SI, HI and AVH. Pt verbally contracts for safety.  Pt asks for sanacks and wants to be sure she gets meds on time. Pt given snack and med schedule reviewed with patient. Pt remains safe on unit with 15 min safety checks.

## 2017-03-22 NOTE — Consult Note (Signed)
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 Stammering and stuttering [F80.81] 03/21/2017  . Tachycardia [R00.0] 03/21/2017  . Abnormal weight gain during pregnancy, antepartum [O26.00] 03/16/2017  . Nexplanon in place [Z97.5] 03/16/2017  . Pelvic pain [R10.2] 02/13/2017  . Vitamin D deficiency [E55.9] 02/13/2017  . Severe obesity (BMI >= 40) ([place]) [E66.01] 02/10/2017  . Episodic tension-type headache, not intractable [G44.219] 02/10/2017  . Elevated LFTs [R79.89] 07/18/2016  . History of normocytic normochromic anemia [Z86.2] 07/18/2016    Total Time spent with patient: 45 minutes  Subjective:   Connie Shea is a 21 y.o. female patient will be observed over night until stable.  HPI:  21 yo sent to the ED after seeing her PA.  She was just started on a weight loss medication and started have "heart racing and knew something was off."  She took her 50 yo daughter to her mother prior to seeing the PA.  She had taken scissors and superficially cut her left arm.  This morning, she was stuttering and frustrated that she was having difficulty speaking.  No prior psychiatric issues besides depression, recently started Effexor (month ago) and was doing well on it.  Agreeable to stay over night as the side effects dissipated.  By midmorning she had minimal stuttering, no speech issues until she started the medications.  No homicidal ideations or suicidal ideations, hallucination, or alcohol/drug abuse.  Past Psychiatric History: depression  Risk to Self: None Risk to Others: Homicidal Ideation: No Thoughts of  Harm to Others: No Current Homicidal Intent: No Current Homicidal Plan: No Access to Homicidal Means: No Identified Victim: No one History of harm to others?: No Assessment of Violence: None Noted Violent Behavior Description: None reported Does patient have access to weapons?: No Criminal Charges Pending?: No Does patient have a court date: No Prior Inpatient Therapy: Prior Inpatient Therapy: No Prior Therapy Dates: N/A Prior Therapy Facilty/Provider(s): N/A Reason for Treatment: N/A Prior Outpatient Therapy: Prior Outpatient Therapy: No Prior Therapy Dates: N/A Prior Therapy Facilty/Provider(s): N/A Reason for Treatment: N/A Does patient have an ACCT team?: No Does patient have Intensive In-House Services?  : No Does patient have [facility] services? : No Does patient have P4CC services?: No  Past Medical History:  Past Medical History:  Diagnosis Date  . Acute pyelonephritis 07/19/2016  . Anemia   . Depression   . Genital herpes   . History of acute PID   . Kidney infection   . Obesity   . Pelvic pain 02/13/2017  . Pre-eclampsia   . Sexually transmitted disease   . Sickle cell trait [facility])     Past Surgical History:  Procedure Laterality Date  . CESAREAN SECTION    . CESAREAN SECTION     Family History:  Family History  Problem Relation Age of Onset  . Hypertension Mother   . Asthma Mother   . Hypertension Maternal Aunt    Family Psychiatric  History: none  Social History:  History  Alcohol Use  . Yes    Comment: 1 day a week     History  Drug Use No    Social History   Social History  . Marital  status: Single    Spouse name: N/A  . Number of children: N/A  . Years of education: N/A   Social History Main Topics  . Smoking status: Never Smoker  . Smokeless tobacco: Never Used  . Alcohol use Yes     Comment: 1 day a week  . Drug use: No  . Sexual activity: Yes    Birth control/ protection: Implant   Other Topics Concern  . Not on file    Social History Narrative  . No narrative on file   Additional Social History:    Allergies:   Allergies  Allergen Reactions  . Phentermine Hcl Other (See Comments)    Tachycardia, Psychosis    Labs:  Results for orders placed or performed during the hospital encounter of 03/21/17 (from the past 48 hour(s))  Comprehensive metabolic panel     Status: Abnormal   Collection Time: 03/21/17  4:45 PM  Result Value Ref Range   Sodium 139 135 - 145 mmol/L   Potassium 3.8 3.5 - 5.1 mmol/L   Chloride 103 101 - 111 mmol/L   CO2 26 22 - 32 mmol/L   Glucose, Bld 90 65 - 99 mg/dL   BUN 11 6 - 20 mg/dL   Creatinine, Ser 0.78 0.44 - 1.00 mg/dL   Calcium 9.5 8.9 - 10.3 mg/dL   Total Protein 8.4 (H) 6.5 - 8.1 g/dL   Albumin 4.8 3.5 - 5.0 g/dL   AST 19 15 - 41 U/L   ALT 19 14 - 54 U/L   Alkaline Phosphatase 74 38 - 126 U/L   Total Bilirubin 0.8 0.3 - 1.2 mg/dL   GFR calc non Af Amer >60 >60 mL/min   GFR calc Af Amer >60 >60 mL/min    Comment: (NOTE) The eGFR has been calculated using the CKD EPI equation. This calculation has not been validated in all clinical situations. eGFR's persistently <60 mL/min signify possible Chronic Kidney Disease.    Anion gap 10 5 - 15  Ethanol     Status: None   Collection Time: 03/21/17  4:45 PM  Result Value Ref Range   Alcohol, Ethyl (B) <5 <5 mg/dL    Comment:        LOWEST DETECTABLE LIMIT FOR SERUM ALCOHOL IS 5 mg/dL FOR MEDICAL PURPOSES ONLY   Salicylate level     Status: None   Collection Time: 03/21/17  4:45 PM  Result Value Ref Range   Salicylate Lvl <3.2 2.8 - 30.0 mg/dL  Acetaminophen level     Status: Abnormal   Collection Time: 03/21/17  4:45 PM  Result Value Ref Range   Acetaminophen (Tylenol), Serum <10 (L) 10 - 30 ug/mL    Comment:        THERAPEUTIC CONCENTRATIONS VARY SIGNIFICANTLY. A RANGE OF 10-30 ug/mL MAY BE AN EFFECTIVE CONCENTRATION FOR MANY PATIENTS. HOWEVER, SOME ARE BEST TREATED AT CONCENTRATIONS OUTSIDE  THIS RANGE. ACETAMINOPHEN CONCENTRATIONS >150 ug/mL AT 4 HOURS AFTER INGESTION AND >50 ug/mL AT 12 HOURS AFTER INGESTION ARE OFTEN ASSOCIATED WITH TOXIC REACTIONS.   cbc     Status: Abnormal   Collection Time: 03/21/17  4:45 PM  Result Value Ref Range   WBC 10.6 (H) 4.0 - 10.5 K/uL   RBC 4.59 3.87 - 5.11 MIL/uL   Hemoglobin 13.1 12.0 - 15.0 g/dL   HCT 37.7 36.0 - 46.0 %   MCV 82.1 78.0 - 100.0 fL   MCH 28.5 26.0 - 34.0 pg   MCHC 34.7 30.0 -  36.0 g/dL   RDW 13.8 11.5 - 15.5 %   Platelets 398 150 - 400 K/uL  Rapid urine drug screen (hospital performed)     Status: Abnormal   Collection Time: 03/21/17  6:07 PM  Result Value Ref Range   Opiates NONE DETECTED NONE DETECTED   Cocaine NONE DETECTED NONE DETECTED   Benzodiazepines NONE DETECTED NONE DETECTED   Amphetamines NONE DETECTED NONE DETECTED   Tetrahydrocannabinol POSITIVE (A) NONE DETECTED   Barbiturates NONE DETECTED NONE DETECTED    Comment:        DRUG SCREEN FOR MEDICAL PURPOSES ONLY.  IF CONFIRMATION IS NEEDED FOR ANY PURPOSE, NOTIFY LAB WITHIN 5 DAYS.        LOWEST DETECTABLE LIMITS FOR URINE DRUG SCREEN Drug Class       Cutoff (ng/mL) Amphetamine      1000 Barbiturate      200 Benzodiazepine   546 Tricyclics       568 Opiates          300 Cocaine          300 THC              50     Current Facility-Administered Medications  Medication Dose Route Frequency Provider Last Rate Last Dose  . [START ON 03/23/2017] buPROPion (WELLBUTRIN XL) 24 hr tablet 150 mg  150 mg Oral Daily Laurie Penado, MD      . lamoTRIgine (LAMICTAL) tablet 25 mg  25 mg Oral Daily Mourad Cwikla, MD   25 mg at 03/22/17 1225  . LORazepam (ATIVAN) tablet 0.5 mg  0.5 mg Oral TID Corena Pilgrim, MD   0.5 mg at 03/22/17 1225  . traZODone (DESYREL) tablet 50 mg  50 mg Oral QHS PRN Corena Pilgrim, MD      . Derrill Memo ON 03/23/2017] venlafaxine XR (EFFEXOR-XR) 24 hr capsule 75 mg  75 mg Oral Q breakfast Darleene Cleaver, Nysir Fergusson, MD      .  Derrill Memo ON 03/23/2017] Vitamin D (Ergocalciferol) (DRISDOL) capsule 50,000 Units  50,000 Units Oral Q7 days Corena Pilgrim, MD       Current Outpatient Prescriptions  Medication Sig Dispense Refill  . etonogestrel (IMPLANON) 68 MG IMPL implant 1 each by Subdermal route once.    . venlafaxine XR (EFFEXOR-XR) 150 MG 24 hr capsule Take 1 capsule (150 mg total) by mouth daily with breakfast. 30 capsule 3  . Vitamin D, Ergocalciferol, (DRISDOL) 50000 units CAPS capsule Take 1 capsule (50,000 Units total) by mouth every 7 (seven) days. Take for 8 total doses(weeks) 8 capsule 0    Musculoskeletal: Strength & Muscle Tone: within normal limits Gait & Station: normal Patient leans: N/A  Psychiatric Specialty Exam: Physical Exam  Constitutional: She appears well-developed and well-nourished.  HENT:  Head: Normocephalic.  Neck: Normal range of motion.  Respiratory: Effort normal.  Musculoskeletal: Normal range of motion.  Neurological: She is alert.  Psychiatric: Her speech is normal and behavior is normal. Judgment and thought content normal. Her mood appears anxious. Cognition and memory are normal. She exhibits a depressed mood.    Review of Systems  Psychiatric/Behavioral: Positive for depression and suicidal ideas.  All other systems reviewed and are negative.   Blood pressure 116/77, pulse 99, temperature 98 F (36.7 C), temperature source Oral, resp. rate 18, SpO2 99 %.There is no height or weight on file to calculate BMI.  General Appearance: Casual  Eye Contact:  Good  Speech:  stuttering  Volume:  Normal  Mood:  Anxious and frustrated  Affect:  Congruent  Thought Process:  Coherent and Descriptions of Associations: Intact  Orientation:  Full (Time, Place, and Person)  Thought Content:  WDL and Logical  Suicidal Thoughts:  No  Homicidal Thoughts:  No  Memory:  Immediate;   Good Recent;   Good Remote;   Good  Judgement:  Fair  Insight:  Fair  Psychomotor Activity:  Normal   Concentration:  Concentration: Good and Attention Span: Good  Recall:  Good  Fund of Knowledge:  Good  Language:  Good  Akathisia:  No  Handed:  Right  AIMS (if indicated):     Assets:  Leisure Time Physical Health Resilience Social Support  ADL's:  Intact  Cognition:  WNL  Sleep:        Treatment Plan Summary: Daily contact with patient to assess and evaluate symptoms and progress in treatment, Medication management and Plan medications induced depression:  -Crisis stabilization -Medication management:   Effexor 150 mg decreased to 75 mg daily for depression, Wellbutrin 150 mg daily for depression started, Lamictal 25 mg daily for mood, and Trazodone 100 mg sleep also started.  Ativan 0.5 mg TID started for side effects of the Phentermine.   -Individual counseling  Disposition: Supportive therapy provided about ongoing stressors.  Waylan Boga, NP 03/22/2017 2:06 PM  Patient seen face-to-face for psychiatric evaluation, chart reviewed and case discussed with the physician extender and developed treatment plan. Reviewed the information documented and agree with the treatment plan. Corena Pilgrim, MD

## 2017-03-22 NOTE — Discharge Instructions (Addendum)
For your ongoing behavioral health needs you are advised to follow up with [facility].  New patients are seen at their walk-in clinic.  Walk-in hours are Monday - Friday from 8:00 am - 12:00 pm, and from 1:00 pm - 3:00 pm.  Walk-in patients are seen on a first come, first served basis, so try to arrive as early as possible for the best chance of being seen the same day.  There is an initial fee of $22.50: ° °     [facility] °     [address] °     [place], [place] [address] °     [phone] °

## 2017-03-22 NOTE — Progress Notes (Signed)
03/22/17 1337:  LRT introduced self to pt and offered activities, pt declined.  Caroll RancherMarjette Shynice Sigel, LRT/CTRS

## 2017-03-23 MED ORDER — TRAZODONE HCL 100 MG PO TABS: 100.0000 mg | ORAL_TABLET | Freq: Every day | ORAL | 0 refills | Status: DC

## 2017-03-23 MED ORDER — LAMOTRIGINE 25 MG PO TABS: 25.0000 mg | ORAL_TABLET | Freq: Every day | ORAL | 0 refills | Status: DC

## 2017-03-23 MED ORDER — VENLAFAXINE HCL ER 75 MG PO CP24: 75.0000 mg | ORAL_CAPSULE | Freq: Every day | ORAL | 0 refills | Status: DC

## 2017-03-23 NOTE — [facility] Suicide Risk Assessment (Signed)
Suicide Risk Assessment  Discharge Assessment   [facility]   Principal Problem: Substance or medication-induced depressive disorder [facility](HCC) Discharge Diagnoses:  Patient Active Problem List   Diagnosis Date Noted  . Substance or medication-induced depressive disorder [facility](HCC) [F19.94] 03/22/2017    Priority: High  . Stammering and stuttering [F80.81] 03/21/2017  . Tachycardia [R00.0] 03/21/2017  . Abnormal weight gain during pregnancy, antepartum [O26.00] 03/16/2017  . Nexplanon in place [Z97.5] 03/16/2017  . Pelvic pain [R10.2] 02/13/2017  . Vitamin D deficiency [E55.9] 02/13/2017  . Severe obesity (BMI >= 40) (HCC) [E66.01] 02/10/2017  . Episodic tension-type headache, not intractable [G44.219] 02/10/2017  . Elevated LFTs [R79.89] 07/18/2016  . History of normocytic normochromic anemia [Z86.2] 07/18/2016    Total Time spent with patient: 30 minutes  Musculoskeletal: Strength & Muscle Tone: within normal limits Gait & Station: normal Patient leans: N/A  Psychiatric Specialty Exam: Physical Exam  Constitutional: She appears well-developed and well-nourished.  HENT:  Head: Normocephalic.  Neck: Normal range of motion.  Respiratory: Effort normal.  Musculoskeletal: Normal range of motion.  Neurological: She is alert.  Psychiatric: Her speech is normal and behavior is normal. Judgment and thought content normal. Cognition and memory are normal. She exhibits a depressed mood.    Review of Systems  Psychiatric/Behavioral: Positive for depression.  All other systems reviewed and are negative.   Blood pressure (!) 141/98, pulse 98, temperature 98.5 F (36.9 C), temperature source Oral, resp. rate 16, SpO2 99 %.There is no height or weight on file to calculate BMI.  General Appearance: Casual  Eye Contact:  Good  Speech:  stuttering  Volume:  Normal  Mood:  Anxious and frustrated  Affect:  Congruent  Thought Process:  Coherent and Descriptions of  Associations: Intact  Orientation:  Full (Time, Place, and Person)  Thought Content:  WDL and Logical  Suicidal Thoughts:  No  Homicidal Thoughts:  No  Memory:  Immediate;   Good Recent;   Good Remote;   Good  Judgement:  Fair  Insight:  Fair  Psychomotor Activity:  Normal  Concentration:  Concentration: Good and Attention Span: Good  Recall:  Good  Fund of Knowledge:  Good  Language:  Good  Akathisia:  No  Handed:  Right  AIMS (if indicated):     Assets:  Leisure Time Physical Health Resilience Social Support  ADL's:  Intact  Cognition:  WNL  Sleep:       Mental Status Per Nursing Assessment::   On Admission:   drug reaction  Demographic Factors:  Adolescent or young adult  Loss Factors: NA  Historical Factors: NA  Risk Reduction Factors:   Responsible for children under 21 years of age, Sense of responsibility to family, Employed, Living with another person, especially a relative and Positive social support  Continued Clinical Symptoms:  Depression, mild  Cognitive Features That Contribute To Risk:  None    Suicide Risk:  Minimal: No identifiable suicidal ideation.  Patients presenting with no risk factors but with morbid ruminations; may be classified as minimal risk based on the severity of the depressive symptoms    Plan Of Care/Follow-up recommendations:  Activity:  as tolerated Diet:  heart healthy diet  Stylianos Stradling, NP 03/23/2017, 10:09 AM

## 2017-03-23 NOTE — ED Notes (Signed)
Pt discharged safely with significant other.  Pt was in no distress.  Prescriptions and discharge instructions were reviewed.  Pt verbalized understanding.  All belongings were returned.

## 2017-03-23 NOTE — Consult Note (Addendum)
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 Substance or medication-induced depressive disorder [facility]) [F19.94] 03/22/2017    Priority: High  . Stammering and stuttering [F80.81] 03/21/2017  . Tachycardia [R00.0] 03/21/2017  . Abnormal weight gain during pregnancy, antepartum [O26.00] 03/16/2017  . Nexplanon in place [Z97.5] 03/16/2017  . Pelvic pain [R10.2] 02/13/2017  . Vitamin D deficiency [E55.9] 02/13/2017  . Severe obesity (BMI >= 40) ([place]) [E66.01] 02/10/2017  . Episodic tension-type headache, not intractable [G44.219] 02/10/2017  . Elevated LFTs [R79.89] 07/18/2016  . History of normocytic normochromic anemia [Z86.2] 07/18/2016    Total Time spent with patient: 30 minutes  Subjective:   Connie Shea is a 21 y.o. female patient is stable for discharge.  HPI:  Patient's medications were adjusted and she cleared her adverse effects of her diet medication.   No homicidal ideations or suicidal ideations, hallucination, or alcohol/drug abuse.  Stable for discharge to her significant other, Rx provided and referral to [facility].  Past Psychiatric History: depression  Risk to Self: None Risk to Others: Homicidal Ideation: No Thoughts of Harm to Others: No Current Homicidal Intent: No Current Homicidal Plan: No Access to Homicidal Means: No Identified Victim: No one History of harm to others?: No Assessment of Violence: None Noted Violent Behavior Description: None reported Does patient have access to weapons?: No Criminal Charges Pending?: No Does patient have a court date: No Prior Inpatient Therapy: Prior Inpatient Therapy: No Prior Therapy Dates: N/A Prior Therapy  Facilty/Provider(s): N/A Reason for Treatment: N/A Prior Outpatient Therapy: Prior Outpatient Therapy: No Prior Therapy Dates: N/A Prior Therapy Facilty/Provider(s): N/A Reason for Treatment: N/A Does patient have an ACCT team?: No Does patient have Intensive In-House Services?  : No Does patient have [facility] services? : No Does patient have P4CC services?: No  Past Medical History:  Past Medical History:  Diagnosis Date  . Acute pyelonephritis 07/19/2016  . Anemia   . Depression   . Genital herpes   . History of acute PID   . Kidney infection   . Obesity   . Pelvic pain 02/13/2017  . Pre-eclampsia   . Sexually transmitted disease   . Sickle cell trait [facility])     Past Surgical History:  Procedure Laterality Date  . CESAREAN SECTION    . CESAREAN SECTION     Family History:  Family History  Problem Relation Age of Onset  . Hypertension Mother   . Asthma Mother   . Hypertension Maternal Aunt    Family Psychiatric  History: none  Social History:  History  Alcohol Use  . Yes    Comment: 1 day a week     History  Drug Use No    Social History   Social History  . Marital status: Single    Spouse name: N/A  . Number of children: N/A  . Years of education: N/A   Social History Main Topics  . Smoking status: Never Smoker  . Smokeless tobacco: Never Used  . Alcohol use Yes     Comment: 1 day a week  . Drug use: No  . Sexual activity: Yes    Birth control/ protection: Implant   Other Topics Concern  . Not on file   Social History Narrative  . No  narrative on file   Additional Social History:    Allergies:   Allergies  Allergen Reactions  . Phentermine Hcl Other (See Comments)    Tachycardia, Psychosis    Labs:  Results for orders placed or performed during the hospital encounter of 03/21/17 (from the past 48 hour(s))  Comprehensive metabolic panel     Status: Abnormal   Collection Time: 03/21/17  4:45 PM  Result Value Ref Range   Sodium 139 135  - 145 mmol/L   Potassium 3.8 3.5 - 5.1 mmol/L   Chloride 103 101 - 111 mmol/L   CO2 26 22 - 32 mmol/L   Glucose, Bld 90 65 - 99 mg/dL   BUN 11 6 - 20 mg/dL   Creatinine, Ser 0.78 0.44 - 1.00 mg/dL   Calcium 9.5 8.9 - 10.3 mg/dL   Total Protein 8.4 (H) 6.5 - 8.1 g/dL   Albumin 4.8 3.5 - 5.0 g/dL   AST 19 15 - 41 U/L   ALT 19 14 - 54 U/L   Alkaline Phosphatase 74 38 - 126 U/L   Total Bilirubin 0.8 0.3 - 1.2 mg/dL   GFR calc non Af Amer >60 >60 mL/min   GFR calc Af Amer >60 >60 mL/min    Comment: (NOTE) The eGFR has been calculated using the CKD EPI equation. This calculation has not been validated in all clinical situations. eGFR's persistently <60 mL/min signify possible Chronic Kidney Disease.    Anion gap 10 5 - 15  Ethanol     Status: None   Collection Time: 03/21/17  4:45 PM  Result Value Ref Range   Alcohol, Ethyl (B) <5 <5 mg/dL    Comment:        LOWEST DETECTABLE LIMIT FOR SERUM ALCOHOL IS 5 mg/dL FOR MEDICAL PURPOSES ONLY   Salicylate level     Status: None   Collection Time: 03/21/17  4:45 PM  Result Value Ref Range   Salicylate Lvl <9.8 2.8 - 30.0 mg/dL  Acetaminophen level     Status: Abnormal   Collection Time: 03/21/17  4:45 PM  Result Value Ref Range   Acetaminophen (Tylenol), Serum <10 (L) 10 - 30 ug/mL    Comment:        THERAPEUTIC CONCENTRATIONS VARY SIGNIFICANTLY. A RANGE OF 10-30 ug/mL MAY BE AN EFFECTIVE CONCENTRATION FOR MANY PATIENTS. HOWEVER, SOME ARE BEST TREATED AT CONCENTRATIONS OUTSIDE THIS RANGE. ACETAMINOPHEN CONCENTRATIONS >150 ug/mL AT 4 HOURS AFTER INGESTION AND >50 ug/mL AT 12 HOURS AFTER INGESTION ARE OFTEN ASSOCIATED WITH TOXIC REACTIONS.   cbc     Status: Abnormal   Collection Time: 03/21/17  4:45 PM  Result Value Ref Range   WBC 10.6 (H) 4.0 - 10.5 K/uL   RBC 4.59 3.87 - 5.11 MIL/uL   Hemoglobin 13.1 12.0 - 15.0 g/dL   HCT 37.7 36.0 - 46.0 %   MCV 82.1 78.0 - 100.0 fL   MCH 28.5 26.0 - 34.0 pg   MCHC 34.7 30.0 -  36.0 g/dL   RDW 13.8 11.5 - 15.5 %   Platelets 398 150 - 400 K/uL  Rapid urine drug screen (hospital performed)     Status: Abnormal   Collection Time: 03/21/17  6:07 PM  Result Value Ref Range   Opiates NONE DETECTED NONE DETECTED   Cocaine NONE DETECTED NONE DETECTED   Benzodiazepines NONE DETECTED NONE DETECTED   Amphetamines NONE DETECTED NONE DETECTED   Tetrahydrocannabinol POSITIVE (A) NONE DETECTED   Barbiturates NONE DETECTED NONE DETECTED  Comment:        DRUG SCREEN FOR MEDICAL PURPOSES ONLY.  IF CONFIRMATION IS NEEDED FOR ANY PURPOSE, NOTIFY LAB WITHIN 5 DAYS.        LOWEST DETECTABLE LIMITS FOR URINE DRUG SCREEN Drug Class       Cutoff (ng/mL) Amphetamine      1000 Barbiturate      200 Benzodiazepine   030 Tricyclics       092 Opiates          300 Cocaine          300 THC              50     Current Facility-Administered Medications  Medication Dose Route Frequency Provider Last Rate Last Dose  . buPROPion (WELLBUTRIN XL) 24 hr tablet 150 mg  150 mg Oral Daily Yazeed Pryer, MD      . lamoTRIgine (LAMICTAL) tablet 25 mg  25 mg Oral Daily Jozy Mcphearson, MD   25 mg at 03/22/17 1225  . LORazepam (ATIVAN) tablet 0.5 mg  0.5 mg Oral TID Corena Pilgrim, MD   0.5 mg at 03/22/17 2156  . traZODone (DESYREL) tablet 100 mg  100 mg Oral QHS Patrecia Pour, NP   100 mg at 03/22/17 2155  . venlafaxine XR (EFFEXOR-XR) 24 hr capsule 75 mg  75 mg Oral Q breakfast Darleene Cleaver, Fran Mcree, MD   75 mg at 03/23/17 0817  . Vitamin D (Ergocalciferol) (DRISDOL) capsule 50,000 Units  50,000 Units Oral Q7 days Corena Pilgrim, MD       Current Outpatient Prescriptions  Medication Sig Dispense Refill  . etonogestrel (IMPLANON) 68 MG IMPL implant 1 each by Subdermal route once.    . venlafaxine XR (EFFEXOR-XR) 150 MG 24 hr capsule Take 1 capsule (150 mg total) by mouth daily with breakfast. 30 capsule 3  . Vitamin D, Ergocalciferol, (DRISDOL) 50000 units CAPS capsule Take 1 capsule  (50,000 Units total) by mouth every 7 (seven) days. Take for 8 total doses(weeks) 8 capsule 0    Musculoskeletal: Strength & Muscle Tone: within normal limits Gait & Station: normal Patient leans: N/A  Psychiatric Specialty Exam: Physical Exam  Constitutional: She appears well-developed and well-nourished.  HENT:  Head: Normocephalic.  Neck: Normal range of motion.  Respiratory: Effort normal.  Musculoskeletal: Normal range of motion.  Neurological: She is alert.  Psychiatric: Her speech is normal and behavior is normal. Judgment and thought content normal. Cognition and memory are normal. She exhibits a depressed mood.    Review of Systems  Psychiatric/Behavioral: Positive for depression.  All other systems reviewed and are negative.   Blood pressure (!) 141/98, pulse 98, temperature 98.5 F (36.9 C), temperature source Oral, resp. rate 16, SpO2 99 %.There is no height or weight on file to calculate BMI.  General Appearance: Casual  Eye Contact:  Good  Speech:  stuttering  Volume:  Normal  Mood:  Anxious and frustrated  Affect:  Congruent  Thought Process:  Coherent and Descriptions of Associations: Intact  Orientation:  Full (Time, Place, and Person)  Thought Content:  WDL and Logical  Suicidal Thoughts:  No  Homicidal Thoughts:  No  Memory:  Immediate;   Good Recent;   Good Remote;   Good  Judgement:  Fair  Insight:  Fair  Psychomotor Activity:  Normal  Concentration:  Concentration: Good and Attention Span: Good  Recall:  Good  Fund of Knowledge:  Good  Language:  Good  Akathisia:  No  Handed:  Right  AIMS (if indicated):     Assets:  Leisure Time Physical Health Resilience Social Support  ADL's:  Intact  Cognition:  WNL  Sleep:        Treatment Plan Summary: Daily contact with patient to assess and evaluate symptoms and progress in treatment, Medication management and Plan medications induced depression:  -Crisis stabilization -Medication management:    Continued Effexor 150 mg decreased to 75 mg daily for depression, Wellbutrin 150 mg daily for depression started, Lamictal 25 mg daily for mood, and Trazodone 100 mg sleep also started.   -Individual counseling -Rx and Family Services provided  Disposition: discharge home   Waylan Boga, NP 03/23/2017 10:07 AM  Patient seen face-to-face for psychiatric evaluation, chart reviewed and case discussed with the physician extender and developed treatment plan. Reviewed the information documented and agree with the treatment plan. Corena Pilgrim, MD

## 2017-03-23 NOTE — BH Assessment (Signed)
[facility] Assessment Progress Note  Per Thedore MinsMojeed Akintayo, MD, this pt does not require psychiatric hospitalization at this time.  [facility]  This has been included in pt's discharge instructions.  Pt's nurse, Kendal Hymendie, has been notified.  Doylene Canninghomas Blade Scheff, MA Triage Specialist [phone]

## 2017-03-29 NOTE — Telephone Encounter (Signed)
Phentermine was approved

## 2017-04-11 ENCOUNTER — Ambulatory Visit: Payer: Managed Care, Other (non HMO) | Admitting: Physician Assistant

## 2017-04-11 DIAGNOSIS — Z0189 Encounter for other specified special examinations: Secondary | ICD-10-CM

## 2017-06-15 ENCOUNTER — Encounter (HOSPITAL_COMMUNITY): Payer: Self-pay | Admitting: Emergency Medicine

## 2017-06-15 ENCOUNTER — Emergency Department (HOSPITAL_COMMUNITY): Payer: 59

## 2017-06-15 ENCOUNTER — Emergency Department (HOSPITAL_COMMUNITY)
Admission: EM | Admit: 2017-06-15 | Discharge: 2017-06-15 | Disposition: A | Discharge status: Home / Self Care | Payer: 59 | Attending: Emergency Medicine | Admitting: Emergency Medicine

## 2017-06-15 DIAGNOSIS — Y999 Unspecified external cause status: Secondary | ICD-10-CM | POA: Insufficient documentation

## 2017-06-15 DIAGNOSIS — Y939 Activity, unspecified: Secondary | ICD-10-CM | POA: Diagnosis not present

## 2017-06-15 DIAGNOSIS — S40812A Abrasion of left upper arm, initial encounter: Secondary | ICD-10-CM | POA: Insufficient documentation

## 2017-06-15 DIAGNOSIS — S40022A Contusion of left upper arm, initial encounter: Secondary | ICD-10-CM | POA: Diagnosis not present

## 2017-06-15 DIAGNOSIS — T07XXXA Unspecified multiple injuries, initial encounter: Secondary | ICD-10-CM

## 2017-06-15 DIAGNOSIS — Y929 Unspecified place or not applicable: Secondary | ICD-10-CM | POA: Diagnosis not present

## 2017-06-15 DIAGNOSIS — S40922A Unspecified superficial injury of left upper arm, initial encounter: Secondary | ICD-10-CM | POA: Diagnosis present

## 2017-06-15 DIAGNOSIS — T148XXA Other injury of unspecified body region, initial encounter: Secondary | ICD-10-CM

## 2017-06-15 HISTORY — DX: Childhood onset fluency disorder: F80.81

## 2017-06-15 MED ORDER — HYDROCODONE-ACETAMINOPHEN 5-325 MG PO TABS
1.0000 | ORAL_TABLET | Freq: Once | ORAL | Status: AC
  Administered 2017-06-15: 1 via ORAL
  Filled 2017-06-15: qty 1

## 2017-06-15 MED ORDER — ACETAMINOPHEN 500 MG PO TABS: 1000.0000 mg | ORAL_TABLET | Freq: Three times a day (TID) | ORAL | 0 refills | Status: AC

## 2017-06-15 NOTE — ED Notes (Signed)
Patient back from x-ray. NAD noted.
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
## 2017-06-16 ENCOUNTER — Ambulatory Visit (INDEPENDENT_AMBULATORY_CARE_PROVIDER_SITE_OTHER): Payer: 59 | Admitting: Physician Assistant

## 2017-06-16 ENCOUNTER — Encounter: Payer: Self-pay | Admitting: Physician Assistant

## 2017-06-16 ENCOUNTER — Ambulatory Visit (INDEPENDENT_AMBULATORY_CARE_PROVIDER_SITE_OTHER): Payer: 59 | Admitting: Family Medicine

## 2017-06-16 DIAGNOSIS — I10 Essential (primary) hypertension: Secondary | ICD-10-CM

## 2017-06-16 DIAGNOSIS — M542 Cervicalgia: Secondary | ICD-10-CM

## 2017-06-16 DIAGNOSIS — F331 Major depressive disorder, recurrent, moderate: Secondary | ICD-10-CM | POA: Diagnosis not present

## 2017-06-16 DIAGNOSIS — Z3046 Encounter for surveillance of implantable subdermal contraceptive: Secondary | ICD-10-CM | POA: Diagnosis not present

## 2017-06-16 DIAGNOSIS — M25519 Pain in unspecified shoulder: Secondary | ICD-10-CM

## 2017-06-16 MED ORDER — CYCLOBENZAPRINE HCL 10 MG PO TABS: 10.0000 mg | ORAL_TABLET | Freq: Three times a day (TID) | ORAL | 0 refills | Status: DC | PRN

## 2017-06-16 MED ORDER — PREDNISONE 50 MG PO TABS: ORAL_TABLET | ORAL | 0 refills | Status: DC
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
## 2017-06-16 NOTE — Patient Instructions (Signed)
Safety Plan: 1. Contact boyfriend/partner 2. Contact parent 3. Our Office [phone] 4. [facility] Crisis Hotline [phone] 5. National Suicide Hotline 1-800-SUICIDE 6. If in immediate danger of harming yourself, go to the nearest emergency room or call 911  - Increase Lamictal to 50mg  (2 tabs) - Continue Effexor 150 mg - Continue Trazodone 100 mg as needed at bedtime  - Abstain from sexual intercourse or use condoms. Fertility can return in 1-2 weeks after Nexplanon removal

## 2017-06-16 NOTE — Progress Notes (Signed)
Patient seen in addition to Carlis Stableummings, Charley Elizabeth, PA-C for Nexplanon removal. Patient would like to Nexplanon removed due to discomfort in her arm. She plans on using backup contraception.  Left arm: Skin on the left upper inner arm is well-appearing with no erythema. Small scar visible. Nontender. Nexplanon is easily palpable just under the skin.  Nexplanon removal: Consent obtained and timeout performed. Skin cleaned with alcohol and the area underneath the distal Nexplanon was injected with lidocaine with epinephrine. After good anesthesia of the skin was cleaned with chlorhexidine and a small incision was made just underneath the skin at the distal tip of the Nexplanon device. Blunt dissection was used to access the space contained in the Nexplanon device. Pressure at the proximal end was used to push the Nexplanon device through the small incision. The next one on was easily removed. Steri-Strips were applied over the wound.  Patient tolerated the procedure well.  F/u with PCP

## 2017-06-21 ENCOUNTER — Encounter: Payer: Self-pay | Admitting: Physician Assistant

## 2017-06-21 ENCOUNTER — Telehealth: Payer: Self-pay | Admitting: Physician Assistant

## 2017-06-21 NOTE — Telephone Encounter (Signed)
Pt called. She states  Provider wrote her out of work for 2 wks but she's feeling fine now and wants to return to work today.  Thank you.

## 2017-06-21 NOTE — Telephone Encounter (Signed)
Work note available at the front desk to return today without restrictions

## 2017-06-22 NOTE — Telephone Encounter (Signed)
Pt notified -EH/RMA  

## 2017-06-23 DIAGNOSIS — M542 Cervicalgia: Secondary | ICD-10-CM

## 2017-06-23 DIAGNOSIS — I10 Essential (primary) hypertension: Secondary | ICD-10-CM | POA: Insufficient documentation

## 2017-06-23 DIAGNOSIS — F331 Major depressive disorder, recurrent, moderate: Secondary | ICD-10-CM | POA: Insufficient documentation

## 2017-06-23 DIAGNOSIS — M25519 Pain in unspecified shoulder: Secondary | ICD-10-CM | POA: Insufficient documentation

## 2017-06-23 MED ORDER — LAMOTRIGINE 25 MG PO TABS: 50.0000 mg | ORAL_TABLET | Freq: Every day | ORAL | 1 refills | Status: DC

## 2017-06-30 ENCOUNTER — Ambulatory Visit: Payer: Self-pay | Admitting: Physician Assistant

## 2017-06-30 DIAGNOSIS — Z0189 Encounter for other specified special examinations: Secondary | ICD-10-CM

## 2017-08-17 ENCOUNTER — Encounter: Payer: Self-pay | Admitting: Physician Assistant

## 2017-08-17 ENCOUNTER — Ambulatory Visit (HOSPITAL_BASED_OUTPATIENT_CLINIC_OR_DEPARTMENT_OTHER)
Admission: RE | Admit: 2017-08-17 | Discharge: 2017-08-17 | Disposition: A | Discharge status: Home / Self Care | Payer: Self-pay | Source: Ambulatory Visit | Attending: Physician Assistant | Admitting: Physician Assistant

## 2017-08-17 ENCOUNTER — Ambulatory Visit (INDEPENDENT_AMBULATORY_CARE_PROVIDER_SITE_OTHER): Payer: Self-pay | Admitting: Physician Assistant

## 2017-08-17 VITALS — BP 129/82 | HR 106 | Temp 98.5°F | Wt 221.5 lb

## 2017-08-17 DIAGNOSIS — F331 Major depressive disorder, recurrent, moderate: Secondary | ICD-10-CM

## 2017-08-17 DIAGNOSIS — Z3201 Encounter for pregnancy test, result positive: Secondary | ICD-10-CM | POA: Insufficient documentation

## 2017-08-17 DIAGNOSIS — R3129 Other microscopic hematuria: Secondary | ICD-10-CM

## 2017-08-17 DIAGNOSIS — R1084 Generalized abdominal pain: Secondary | ICD-10-CM

## 2017-08-17 DIAGNOSIS — R82998 Other abnormal findings in urine: Secondary | ICD-10-CM

## 2017-08-17 DIAGNOSIS — Z131 Encounter for screening for diabetes mellitus: Secondary | ICD-10-CM

## 2017-08-17 DIAGNOSIS — R630 Anorexia: Secondary | ICD-10-CM | POA: Insufficient documentation

## 2017-08-17 DIAGNOSIS — E559 Vitamin D deficiency, unspecified: Secondary | ICD-10-CM

## 2017-08-17 HISTORY — DX: Encounter for pregnancy test, result positive: Z32.01

## 2017-08-17 LAB — POCT URINALYSIS DIPSTICK
Glucose, UA: NEGATIVE
KETONES UA: NEGATIVE
Nitrite, UA: NEGATIVE
PH UA: 6 (ref 5.0–8.0)
SPEC GRAV UA: 1.025 (ref 1.010–1.025)
Urobilinogen, UA: 1 E.U./dL

## 2017-08-17 LAB — POCT URINE PREGNANCY: PREG TEST UR: POSITIVE — AB

## 2017-08-17 MED ORDER — CEPHALEXIN 500 MG PO CAPS: 500.0000 mg | ORAL_CAPSULE | Freq: Two times a day (BID) | ORAL | 0 refills | Status: DC

## 2017-08-17 MED ORDER — VENLAFAXINE HCL ER 37.5 MG PO CP24: ORAL_CAPSULE | ORAL | 0 refills | Status: DC

## 2017-08-17 MED ORDER — SERTRALINE HCL 25 MG PO TABS: ORAL_TABLET | ORAL | 0 refills | Status: DC

## 2017-08-17 NOTE — Progress Notes (Signed)
HPI:                                                                Connie Shea is a 21 y.o. female who [facility] Primary [facility] Medicine today for decreased appetite  Patient reports poor appetite and weight loss present for approximately 1 month. She states the smell of food nauseates her. She has had multiple episodes of bilious vomiting. Endorses generalized abdominal pain. Denies fever, chills, hematemesis, diarrhea, hematochezia. She reports regular unprotected intercourse, not using any form of contraception. LMP approximately 7-8 weeks ago.  Past Medical History:  Diagnosis Date  . Acute pyelonephritis 07/19/2016  . Anemia   . Depression   . Epilepsy (HCC)   . Genital herpes   . History of acute PID   . Kidney infection   . Obesity   . Pelvic pain 02/13/2017  . Pre-eclampsia   . Sexually transmitted disease   . Sickle cell trait (HCC)   . Stutter    Past Surgical History:  Procedure Laterality Date  . CESAREAN SECTION    . CESAREAN SECTION     Social History   Tobacco Use  . Smoking status: Never Smoker  . Smokeless tobacco: Never Used  Substance Use Topics  . Alcohol use: Yes    Comment: 1 day a week   family history includes Asthma in her mother; Hypertension in her maternal aunt and mother.  ROS: negative except as noted in the HPI  Medications: Current Outpatient Medications  Medication Sig Dispense Refill  . hydrochlorothiazide (HYDRODIURIL) 25 MG tablet Take 1 tablet by mouth daily.    Marland Kitchen. lamoTRIgine (LAMICTAL) 25 MG tablet Take 2 tablets (50 mg total) by mouth daily. 60 tablet 1  . traZODone (DESYREL) 100 MG tablet Take 1 tablet (100 mg total) by mouth at bedtime. 30 tablet 0  . cephALEXin (KEFLEX) 500 MG capsule Take 1 capsule (500 mg total) 2 (two) times daily by mouth. 14 capsule 0  . Ergocalciferol 2000 units CAPS Take 1 capsule daily by mouth. 30 capsule 11  . sertraline (ZOLOFT) 25 MG tablet Take 1 tablet  (25 mg total) daily for 7 days by mouth, THEN 2 tablets (50 mg total) daily for 23 days. 60 tablet 0  . venlafaxine XR (EFFEXOR XR) 37.5 MG 24 hr capsule Take 2 capsules (75 mg total) daily with breakfast for 7 days by mouth, THEN 1 capsule (37.5 mg total) daily with breakfast for 7 days. One tab by mouth daily for 3 days then switch to the higher dose. 21 capsule 0   No current facility-administered medications for this visit.    Allergies  Allergen Reactions  . Phentermine Hcl Other (See Comments)    Tachycardia, Psychosis       Objective:  BP 129/82   Pulse (!) 106   Temp 98.5 F (36.9 C)   Wt 221 lb 8 oz (100.5 kg)   LMP 06/16/2017 (Approximate)   SpO2 99%   BMI 38.02 kg/m  Gen:  alert, not ill-appearing, no distress, appropriate for age, obese female HEENT: head normocephalic without obvious abnormality, conjunctiva and cornea clear, neck supple, no adenopathy, trachea midline Pulm: Normal work of breathing, normal phonation, clear to auscultation bilaterally, no wheezes, rales  or rhonchi CV: Normal rate, regular rhythm, s1 and s2 distinct, no murmurs, clicks or rubs  GI: abdomen obese, soft, nontender, non-distended, exam limited by body habitus, Neuro: alert and oriented x 3, no tremor MSK: extremities atraumatic, normal gait and station Skin: intact, no rashes on exposed skin, no jaundice, no cyanosis  Results for orders placed or performed in visit on 08/17/17 (from the past 72 hour(s))  POCT urinalysis dipstick     Status: Abnormal   Collection Time: 08/17/17  3:53 PM  Result Value Ref Range   Color, UA Amber    Clarity, UA Cloudy    Glucose, UA neg    Bilirubin, UA small    Ketones, UA neg    Spec Grav, UA 1.025 1.010 - 1.025   Blood, UA moderate    pH, UA 6.0 5.0 - 8.0   Protein, UA 30 mg/dl    Urobilinogen, UA 1.0 0.2 or 1.0 E.U./dL   Nitrite, UA neg    Leukocytes, UA Trace (A) Negative  POCT urine pregnancy     Status: Abnormal   Collection Time:  08/17/17  3:55 PM  Result Value Ref Range   Preg Test, Ur Positive (A) Negative  CBC with Differential/Platelet     Status: None   Collection Time: 08/17/17  4:06 PM  Result Value Ref Range   WBC 10.8 3.8 - 10.8 Thousand/uL   RBC 4.65 3.80 - 5.10 Million/uL   Hemoglobin 13.2 11.7 - 15.5 g/dL   HCT 16.1 09.6 - 04.5 %   MCV 84.5 80.0 - 100.0 fL   MCH 28.4 27.0 - 33.0 pg   MCHC 33.6 32.0 - 36.0 g/dL   RDW 40.9 81.1 - 91.4 %   Platelets 393 140 - 400 Thousand/uL   MPV 11.2 7.5 - 12.5 fL   Neutro Abs 6,199 1,500 - 7,800 cells/uL   Lymphs Abs 3,197 850 - 3,900 cells/uL   WBC mixed population 907 200 - 950 cells/uL   Eosinophils Absolute 356 15 - 500 cells/uL   Basophils Absolute 140 0 - 200 cells/uL   Neutrophils Relative % 57.4 %   Total Lymphocyte 29.6 %   Monocytes Relative 8.4 %   Eosinophils Relative 3.3 %   Basophils Relative 1.3 %  Comprehensive metabolic panel     Status: None   Collection Time: 08/17/17  4:06 PM  Result Value Ref Range   Glucose, Bld 97 65 - 99 mg/dL    Comment: .            Fasting reference interval .    BUN 8 7 - 25 mg/dL   Creat 7.82 9.56 - 2.13 mg/dL   BUN/Creatinine Ratio NOT APPLICABLE 6 - 22 (calc)   Sodium 138 135 - 146 mmol/L   Potassium 3.9 3.5 - 5.3 mmol/L   Chloride 103 98 - 110 mmol/L   CO2 24 20 - 32 mmol/L   Calcium 9.7 8.6 - 10.2 mg/dL   Total Protein 7.2 6.1 - 8.1 g/dL   Albumin 4.4 3.6 - 5.1 g/dL   Globulin 2.8 1.9 - 3.7 g/dL (calc)   AG Ratio 1.6 1.0 - 2.5 (calc)   Total Bilirubin 0.6 0.2 - 1.2 mg/dL   Alkaline phosphatase (APISO) 60 33 - 115 U/L   AST 16 10 - 30 U/L   ALT 15 6 - 29 U/L  Hemoglobin A1c     Status: None   Collection Time: 08/17/17  4:06 PM  Result Value Ref Range  Hgb A1c MFr Bld 5.3 <5.7 % of total Hgb    Comment: For the purpose of screening for the presence of diabetes: . <5.7%       Consistent with the absence of diabetes 5.7-6.4%    Consistent with increased risk for diabetes              (prediabetes) > or =6.5%  Consistent with diabetes . This assay result is consistent with a decreased risk of diabetes. . Currently, no consensus exists regarding use of hemoglobin A1c for diagnosis of diabetes in children. . According to American Diabetes Association (ADA) guidelines, hemoglobin A1c <7.0% represents optimal control in non-pregnant diabetic patients. Different metrics may apply to specific patient populations.  Standards of Medical Care in Diabetes(ADA). .    Mean Plasma Glucose 105 (calc)   eAG (mmol/L) 5.8 (calc)  Lipase     Status: None   Collection Time: 08/17/17  4:06 PM  Result Value Ref Range   Lipase 13 7 - 60 U/L  VITAMIN D 25 Hydroxy (Vit-D Deficiency, Fractures)     Status: Abnormal   Collection Time: 08/17/17  4:06 PM  Result Value Ref Range   Vit D, 25-Hydroxy 17 (L) 30 - 100 ng/mL    Comment: Vitamin D Status         25-OH Vitamin D: . Deficiency:                    <20 ng/mL Insufficiency:             20 - 29 ng/mL Optimal:                 > or = 30 ng/mL . For 25-OH Vitamin D testing on patients on  D2-supplementation and patients for whom quantitation  of D2 and D3 fractions is required, the QuestAssureD(TM) 25-OH VIT D, (D2,D3), LC/MS/MS is recommended: order  code [id number] (patients >7053yrs). . For more information on this test, go to: http://education.questdiagnostics.com/faq/FAQ163 (This link is being provided for  informational/educational purposes only.)   B-HCG Quant     Status: None   Collection Time: 08/17/17  4:06 PM  Result Value Ref Range   HCG, Total, QN 101 mIU/mL    Comment: Gestational Age   Expected hCG values (mIU/mL) <1 Week:                 5-50 1-2 Weeks:               50-500 2-3 Weeks:               [phone] 3-4 Weeks:               500-10000 4-5 Weeks:               1000-50000 5-6 Weeks:               10000-100000 6-8 Weeks:               15000-200000 2-3 Months:              10000-100000 The table above  provides only a very rough estimate of gestational age and should be used only in conjunction with other methods for establishing gestational age. Much more reliable and accurate estimations of gestational age may be obtained by using LMP or ultrasound. . . Values from different assay methods may vary. The use of this assay to monitor or to diagnose  patients with cancer or any condition  unrelated to pregnancy has not been cleared or approved by the FDA or the manufacturer of the assay.    US Ob Comp Less 14 Wks  Result Date: 08/17/2017 CLINICAL DATA:  Nausea and vomiting with abdominal pain. Positive pregnancy test. EXAM: OBSTETRIC <14 WK [place] AND TRANSVAGINAL OB US TECHNIQUE: Both transabdominal and transvaginal ultrasound examinations were performed for complete evaluation of the gestation as well as the maternal uterus, adnexal regions, and pelvic cul-de-sac. Transvaginal technique was performed to assess early pregnancy. COMPARISON:  None. FINDINGS: Intrauterine gestational sac: Not visualized. Subchorionic hemorrhage:  None visualized. Maternal uterus/adnexae: Maternal ovaries unremarkable. No adnexal mass. No intraperitoneal free fluid. IMPRESSION: No evidence for intrauterine gestation. Given the history of a positive pregnancy test, differential considerations include intrauterine gestation too early to visualize, completed abortion, or nonvisualized ectopic pregnancy. Close clinical correlation is recommended with serial beta-hCG and followup ultrasound as warranted. Electronically Signed   By: Kennith Center M.D.   On: 08/17/2017 20:09   US Ob Transvaginal  Result Date: 08/17/2017 CLINICAL DATA:  Nausea and vomiting with abdominal pain. Positive pregnancy test. EXAM: OBSTETRIC <14 WK [place] AND TRANSVAGINAL OB US TECHNIQUE: Both transabdominal and transvaginal ultrasound examinations were performed for complete evaluation of the gestation as well as the maternal uterus, adnexal regions, and  pelvic cul-de-sac. Transvaginal technique was performed to assess early pregnancy. COMPARISON:  None. FINDINGS: Intrauterine gestational sac: Not visualized. Subchorionic hemorrhage:  None visualized. Maternal uterus/adnexae: Maternal ovaries unremarkable. No adnexal mass. No intraperitoneal free fluid. IMPRESSION: No evidence for intrauterine gestation. Given the history of a positive pregnancy test, differential considerations include intrauterine gestation too early to visualize, completed abortion, or nonvisualized ectopic pregnancy. Close clinical correlation is recommended with serial beta-hCG and followup ultrasound as warranted. Electronically Signed   By: Kennith Center M.D.   On: 08/17/2017 20:09      Assessment and Plan: 21 y.o. female with   1. Decreased appetite   2. Generalized abdominal pain - CBC with Differential/Platelet - Comprehensive metabolic panel - Hemoglobin A1c - Lipase - Urine Culture - POCT urinalysis dipstick - US OB Transvaginal - US OB Comp Less 14 Wks  3. Screening for diabetes mellitus - Hemoglobin A1c   4. Positive urine pregnancy test - POCT urine pregnancy positive. In the setting of generalized abdominal pain, ordering stat pelvic/transvaginal ultrasound to r/o ectopic - B-HCG Quant - US OB Transvaginal - US OB Comp Less 14 Wks  5. Vitamin D deficiency - VITAMIN D 25 Hydroxy (Vit-D Deficiency, Fractures)  6. Other microscopic hematuria - POCT urinalysis dipstick positive for moderate blood and trace leukocytes. Urine culture pending. Treating empirically with Keflex. - cephALEXin (KEFLEX) 500 MG capsule; Take 1 capsule (500 mg total) 2 (two) times daily by mouth.  Dispense: 14 capsule; Refill: 0 - Urine Culture   7. Leukocytes in urine - cephALEXin (KEFLEX) 500 MG capsule; Take 1 capsule (500 mg total) 2 (two) times daily by mouth.  Dispense: 14 capsule; Refill: 0 - Urine Culture - POCT urinalysis dipstick  8. Moderate episode of  recurrent major depressive disorder (HCC) - due to positive pregnancy test, patient will be tapered off of Effexor and titrated on to Sertraline. Recommend she is followed by Psychiatry given her history of severe MDD and postpartum depression - venlafaxine XR (EFFEXOR XR) 37.5 MG 24 hr capsule; Take 2 capsules (75 mg total) daily with breakfast for 7 days by mouth, THEN 1 capsule (37.5 mg total) daily with breakfast for 7 days. One tab  by mouth daily for 3 days then switch to the higher dose.  Dispense: 21 capsule; Refill: 0 - sertraline (ZOLOFT) 25 MG tablet; Take 1 tablet (25 mg total) daily for 7 days by mouth, THEN 2 tablets (50 mg total) daily for 23 days.  Dispense: 60 tablet; Refill: 0 - Ambulatory referral to Psychiatry  Patient education and anticipatory guidance given Patient agrees with treatment plan Follow-up in 1 day or sooner as needed if symptoms worsen or fail to improve  Levonne Hubert PA-C

## 2017-08-17 NOTE — Patient Instructions (Signed)
Go downstairs for labs today  [facility]. Arrive at 6:15pm with a full bladder. Register in the main entrance  Due to positive pregnancy test, we need to adjust some of your long-term medications We are going to taper you off of Effexor and at the same time start you on Sertraline Follow the instructions on the pill bottles carefully -Reduce you Effexor to 75 mg daily for 1 week. At the same time start Sertraline 25mg  daily - Reduce your Effexor to 150 mg daily for an additional week. At the same time increase your Sertraline to 50 mg daily - Then stop the Effexor - Follow-up with Psychiatry  Start Keflex (antibiotic) 1 tab twice a day for 1 week for urinary tract infection Drink plenty of water

## 2017-08-18 ENCOUNTER — Encounter: Payer: Self-pay | Admitting: Physician Assistant

## 2017-08-18 ENCOUNTER — Ambulatory Visit (INDEPENDENT_AMBULATORY_CARE_PROVIDER_SITE_OTHER): Payer: Self-pay | Admitting: Physician Assistant

## 2017-08-18 VITALS — BP 135/89 | HR 98 | Wt 221.0 lb

## 2017-08-18 DIAGNOSIS — Z3201 Encounter for pregnancy test, result positive: Secondary | ICD-10-CM

## 2017-08-18 DIAGNOSIS — E559 Vitamin D deficiency, unspecified: Secondary | ICD-10-CM

## 2017-08-18 DIAGNOSIS — N898 Other specified noninflammatory disorders of vagina: Secondary | ICD-10-CM

## 2017-08-18 LAB — CBC WITH DIFFERENTIAL/PLATELET
BASOS ABS: 140 {cells}/uL (ref 0–200)
Basophils Relative: 1.3 %
EOS PCT: 3.3 %
Eosinophils Absolute: 356 cells/uL (ref 15–500)
HCT: 39.3 % (ref 35.0–45.0)
HEMOGLOBIN: 13.2 g/dL (ref 11.7–15.5)
Lymphs Abs: 3197 cells/uL (ref 850–3900)
MCH: 28.4 pg (ref 27.0–33.0)
MCHC: 33.6 g/dL (ref 32.0–36.0)
MCV: 84.5 fL (ref 80.0–100.0)
MONOS PCT: 8.4 %
MPV: 11.2 fL (ref 7.5–12.5)
NEUTROS ABS: 6199 {cells}/uL (ref 1500–7800)
NEUTROS PCT: 57.4 %
Platelets: 393 10*3/uL (ref 140–400)
RBC: 4.65 10*6/uL (ref 3.80–5.10)
RDW: 13.2 % (ref 11.0–15.0)
Total Lymphocyte: 29.6 %
WBC mixed population: 907 cells/uL (ref 200–950)
WBC: 10.8 10*3/uL (ref 3.8–10.8)

## 2017-08-18 LAB — WET PREP FOR TRICH, YEAST, CLUE
MICRO NUMBER:: 81264502
Specimen Quality: ADEQUATE

## 2017-08-18 LAB — COMPREHENSIVE METABOLIC PANEL
AG RATIO: 1.6 (calc) (ref 1.0–2.5)
ALT: 15 U/L (ref 6–29)
AST: 16 U/L (ref 10–30)
Albumin: 4.4 g/dL (ref 3.6–5.1)
Alkaline phosphatase (APISO): 60 U/L (ref 33–115)
BUN: 8 mg/dL (ref 7–25)
CALCIUM: 9.7 mg/dL (ref 8.6–10.2)
CO2: 24 mmol/L (ref 20–32)
Chloride: 103 mmol/L (ref 98–110)
Creat: 0.62 mg/dL (ref 0.50–1.10)
GLUCOSE: 97 mg/dL (ref 65–99)
Globulin: 2.8 g/dL (calc) (ref 1.9–3.7)
Potassium: 3.9 mmol/L (ref 3.5–5.3)
SODIUM: 138 mmol/L (ref 135–146)
TOTAL PROTEIN: 7.2 g/dL (ref 6.1–8.1)
Total Bilirubin: 0.6 mg/dL (ref 0.2–1.2)

## 2017-08-18 LAB — HEMOGLOBIN A1C
Hgb A1c MFr Bld: 5.3 %{Hb} (ref <5.7)
Mean Plasma Glucose: 105 (calc)
eAG (mmol/L): 5.8 (calc)

## 2017-08-18 LAB — HCG, QUANTITATIVE, PREGNANCY: HCG, Total, QN: 101 m[IU]/mL

## 2017-08-18 LAB — VITAMIN D 25 HYDROXY (VIT D DEFICIENCY, FRACTURES): Vit D, 25-Hydroxy: 17 ng/mL — ABNORMAL LOW (ref 30–100)

## 2017-08-18 LAB — LIPASE: LIPASE: 13 U/L (ref 7–60)

## 2017-08-18 MED ORDER — ERGOCALCIFEROL 50 MCG (2000 UT) PO CAPS: 1.0000 | ORAL_CAPSULE | Freq: Every day | ORAL | 11 refills | Status: DC

## 2017-08-18 NOTE — Patient Instructions (Addendum)
Go to the lab tomorrow  [facility] located on [address], [address] [place] [facility] between 8am and 11:30am  If for some reason, they are closed, you need to go to [facility]. Bring your order requisition form with you  If any vaginal bleeding, severe abdominal pain, persistent nausea/vomiting go to the nearest Emergency Room  Your Vitamin D is deficient Take vitamin D2 (ergocalciferol) 2000 units daily. DO NOT EXCEED 2000 UNITS PER DAY  Human Chorionic Gonadotropin Test Human chorionic gonadotropin (hCG) is a hormone produced during pregnancy by the cells that form the placenta. The placenta is the organ that grows inside your womb (uterus) to nourish a developing baby. When you are pregnant, hCG starts to appear in your blood about 11 days after conception. It continues to go up for the first 8-11 weeks of pregnancy. Your hCG level can be measured with several different types of tests. You may have:  A urine test. ? hCG is eliminated from your body by your kidneys, so a urine test is one way to check for this hormone. ? A urine test only shows whether there is hCG in your urine. It does not measure how much. ? You may have a urine test to find out whether you are pregnant. ? A home pregnancy test detects whether there is hCG in your urine.  A qualitative blood test. ? Like the urine test, this blood test only shows whether there is hCG in your blood. It does not measure how much. ? You may have this type of blood test to find out whether you are pregnant.  A quantitative blood test. ? This type of blood test measures the amount of hCG in your blood. ? You may have this type of test to diagnose an abnormal pregnancy or determine whether you are at risk of, or have had, a failed pregnancy (miscarriage).  How do I prepare for this test? For the urine test:  Limit your fluid intake before the urine test as directed by your health care provider.  Collect the sample the first time  you urinate in the morning.  Let your health care provider know if you have blood in your urine. This may interfere with the test result.  Some medicines may interfere with the urine and blood tests. Let your health care provider know about all the medicines you are taking. No additional preparation is required for the blood test. What do the results mean? It is your responsibility to obtain your test results. Ask the lab or department performing the test when and how you will get your results. Talk to your health care provider if you have any questions about your test results. The results of the hCG urine test and the qualitative hCG blood test are either positive or negative. The results of the quantitative hCG blood test are reported as a number. hCG is measured in international units per liter (IU/L). Meaning of Negative Test Results A negative result on a urine or qualitative blood test could mean that you are not pregnant. It could also mean the test was done too early to detect hCG. If you still have other signs of pregnancy, the test should be repeated. Meaning of Positive Test Results A positive result on the urine or qualitative blood tests means you are most likely pregnant. Your health care provider may confirm your pregnancy with an imaging study of the inside of your uterus at 5-6 weeks (ultrasound). Range of Normal Values Ranges for normal  values for the quantitative hCG blood test may vary among different labs and hospitals. You should always check with your health care provider after having lab work or other tests done to discuss whether your values are considered within normal limits.  Less than 5 IU/L means it is most likely you are not pregnant.  Greater than 25 IU/L means it is most likely you are pregnant.  Meaning of Results Outside Normal Value Ranges If your hCG level on the quantitative test is not what would be expected, you may have the test again. It may also be  important for your health care provider to know whether your hCG level goes up or down over time. Common causes of results outside the normal range include:  Being pregnant with twins (hCG level is higher than expected).  Having an ectopic pregnancy (hCG rises more slowly than expected).  Miscarriage (hCG level falls).  Abnormal growths in the womb (hCG level is higher than expected).  Talk with your health care provider to discuss your results, treatment options, and if necessary, the need for more tests. Talk with your health care provider if you have any questions about your results. This information is not intended to replace advice given to you by your health care provider. Make sure you discuss any questions you have with your health care provider. Document Released: 10/28/2004 Document Revised: 06/01/2016 Document Reviewed: 12/31/2013 Elsevier Interactive Patient Education  2018 [employer]   Vaginitis Vaginitis is a condition in which the vaginal tissue swells and becomes red (inflamed). This condition is most often caused by a change in the normal balance of bacteria and yeast that live in the vagina. This change causes an overgrowth of certain bacteria or yeast, which causes the inflammation. There are different types of vaginitis, but the most common types are:  Bacterial vaginosis.  Yeast infection (candidiasis).  Trichomoniasis vaginitis. This is a sexually transmitted disease (STD).  Viral vaginitis.  Atrophic vaginitis.  Allergic vaginitis.  What are the causes? The cause of this condition depends on the type of vaginitis. It can be caused by:  Bacteria (bacterial vaginosis).  Yeast, which is a fungus (yeast infection).  A parasite (trichomoniasis vaginitis).  A virus (viral vaginitis).  Low hormone levels (atrophic vaginitis). Low hormone levels can occur during pregnancy, breastfeeding, or after menopause.  Irritants, such as bubble baths, scented  tampons, and feminine sprays (allergic vaginitis).  Other factors can change the normal balance of the yeast and bacteria that live in the vagina. These include:  Antibiotic medicines.  Poor hygiene.  Diaphragms, vaginal sponges, spermicides, birth control pills, and intrauterine devices (IUD).  Sex.  Infection.  Uncontrolled diabetes.  A weakened defense (immune) system.  What increases the risk? This condition is more likely to develop in women who:  Smoke.  Use vaginal douches, scented tampons, or scented sanitary pads.  Wear tight-fitting pants.  Wear thong underwear.  Use oral birth control pills or an IUD.  Have sex without a condom.  Have multiple sex partners.  Have an STD.  Frequently use the spermicide nonoxynol-9.  Eat lots of foods high in sugar.  Have uncontrolled diabetes.  Have low estrogen levels.  Have a weakened immune system from an immune disorder or medical treatment.  Are pregnant or breastfeeding.  What are the signs or symptoms? Symptoms vary depending on the cause of the vaginitis. Common symptoms include:  Abnormal vaginal discharge. ? The discharge is white, gray, or yellow with bacterial vaginosis. ? The  discharge is thick, white, and cheesy with a yeast infection. ? The discharge is frothy and yellow or greenish with trichomoniasis.  A bad vaginal smell. The smell is fishy with bacterial vaginosis.  Vaginal itching, pain, or swelling.  Sex that is painful.  Pain or burning when urinating.  Sometimes there are no symptoms. How is this diagnosed? This condition is diagnosed based on your symptoms and medical history. A physical exam, including a pelvic exam, will also be done. You may also have other tests, including:  Tests to determine the pH level (acidity or alkalinity) of your vagina.  A whiff test, to assess the odor that results when a sample of your vaginal discharge is mixed with a potassium hydroxide  solution.  Tests of vaginal fluid. A sample will be examined under a microscope.  How is this treated? Treatment varies depending on the type of vaginitis you have. Your treatment may include:  Antibiotic creams or pills to treat bacterial vaginosis and trichomoniasis.  Antifungal medicines, such as vaginal creams or suppositories, to treat a yeast infection.  Medicine to ease discomfort if you have viral vaginitis. Your sexual partner should also be treated.  Estrogen delivered in a cream, pill, suppository, or vaginal ring to treat atrophic vaginitis. If vaginal dryness occurs, lubricants and moisturizing creams may help. You may need to avoid scented soaps, sprays, or douches.  Stopping use of a product that is causing allergic vaginitis. Then using a vaginal cream to treat the symptoms.  Follow these instructions at home: Lifestyle  Keep your genital area clean and dry. Avoid soap, and only rinse the area with water.  Do not douche or use tampons until your health care provider says it is okay to do so. Use sanitary pads, if needed.  Do not have sex until your health care provider approves. When you can return to sex, practice safe sex and use condoms.  Wipe from front to back. This avoids the spread of bacteria from the rectum to the vagina. General instructions  Take over-the-counter and prescription medicines only as told by your health care provider.  If you were prescribed an antibiotic medicine, take or use it as told by your health care provider. Do not stop taking or using the antibiotic even if you start to feel better.  Keep all follow-up visits as told by your health care provider. This is important. How is this prevented?  Use mild, non-scented products. Do not use things that can irritate the vagina, such as fabric softeners. Avoid the following products if they are scented: ? Feminine sprays. ? Detergents. ? Tampons. ? Feminine hygiene products. ? Soaps or  bubble baths.  Let air reach your genital area. ? Wear cotton underwear to reduce moisture buildup. ? Avoid wearing underwear while you sleep. ? Avoid wearing tight pants and underwear or nylons without a cotton panel. ? Avoid wearing thong underwear.  Take off any wet clothing, such as bathing suits, as soon as possible.  Practice safe sex and use condoms. Contact a health care provider if:  You have abdominal pain.  You have a fever.  You have symptoms that last for more than 2-3 days. Get help right away if:  You have a fever and your symptoms suddenly get worse. Summary  Vaginitis is a condition in which the vaginal tissue becomes inflamed.This condition is most often caused by a change in the normal balance of bacteria and yeast that live in the vagina.  Treatment varies depending on  the type of vaginitis you have.  Do not douche, use tampons , or have sex until your health care provider approves. When you can return to sex, practice safe sex and use condoms. This information is not intended to replace advice given to you by your health care provider. Make sure you discuss any questions you have with your health care provider. Document Released: 07/24/2007 Document Revised: 11/01/2016 Document Reviewed: 11/01/2016 Elsevier Interactive Patient Education  [employer].

## 2017-08-18 NOTE — Progress Notes (Signed)
HPI:                                                                Connie Shea is a [age] y.o. female who [facility] today for vaginal discharge  Patient reports 2 months of clear, vaginal discharge that she reports is "bleaching" her underwear. Reports history of STI, including genital herpes. She has been having unprotected intercourse with 1 female partner. She had a positive urine pregnancy test yesterday and hcg quant was 101. Denies fever, chills, vaginal bleeding. Continues to endorse mild, dull, bilateral lower abdominal pain as well as decreased appetite.  Past Medical History:  Diagnosis Date  . Acute pyelonephritis 07/19/2016  . Anemia   . Depression   . Epilepsy (HCC)   . Genital herpes   . History of acute PID   . Kidney infection   . Obesity   . Pelvic pain 02/13/2017  . Pre-eclampsia   . Sexually transmitted disease   . Sickle cell trait (HCC)   . Stutter    Past Surgical History:  Procedure Laterality Date  . CESAREAN SECTION    . CESAREAN SECTION     Social History   Tobacco Use  . Smoking status: Never Smoker  . Smokeless tobacco: Never Used  Substance Use Topics  . Alcohol use: Yes    Comment: 1 day a week   family history includes Asthma in her mother; Hypertension in her maternal aunt and mother.  ROS: negative except as noted in the HPI  Medications: Current Outpatient Medications  Medication Sig Dispense Refill  . cephALEXin (KEFLEX) 500 MG capsule Take 1 capsule (500 mg total) 2 (two) times daily by mouth. 14 capsule 0  . hydrochlorothiazide (HYDRODIURIL) 25 MG tablet Take 1 tablet by mouth daily.    Marland Kitchen. lamoTRIgine (LAMICTAL) 25 MG tablet Take 2 tablets (50 mg total) by mouth daily. 60 tablet 1  . sertraline (ZOLOFT) 25 MG tablet Take 1 tablet (25 mg total) daily for 7 days by mouth, THEN 2 tablets (50 mg total) daily for 23 days. 60 tablet 0  . traZODone (DESYREL) 100 MG tablet Take 1  tablet (100 mg total) by mouth at bedtime. 30 tablet 0  . venlafaxine XR (EFFEXOR XR) 37.5 MG 24 hr capsule Take 2 capsules (75 mg total) daily with breakfast for 7 days by mouth, THEN 1 capsule (37.5 mg total) daily with breakfast for 7 days. One tab by mouth daily for 3 days then switch to the higher dose. 21 capsule 0  . Vitamin D, Ergocalciferol, (DRISDOL) 50000 units CAPS capsule Take 1 capsule (50,000 Units total) by mouth every 7 (seven) days. Take for 8 total doses(weeks) 8 capsule 0   No current facility-administered medications for this visit.    Allergies  Allergen Reactions  . Phentermine Hcl Other (See Comments)    Tachycardia, Psychosis       Objective:  BP 135/89   Pulse 98   Wt 221 lb (100.2 kg)   BMI 37.93 kg/m  Gen:  alert, not ill-appearing, no distress, appropriate for age, obese female HEENT: head normocephalic without obvious abnormality, conjunctiva and cornea clear, trachea midline Pulm: Normal work of breathing, normal phonation GU: vulva without rashes or  lesions, normal introitus and urethral meatus, vaginal mucosa without erythema, no discharge, no blood in the vaginal vault, unable to visualize cervix secondary to pain on speculum exam  Neuro: alert and oriented x 3, no tremor MSK: extremities atraumatic, normal gait and station Skin: intact, no rashes on exposed skin, no jaundice, no cyanosis  A chaperone was used for the GU portion of the exam, Olivia Mackie, CMA.    Results for orders placed or performed in visit on 08/17/17 (from the past 72 hour(s))  POCT urinalysis dipstick     Status: Abnormal   Collection Time: 08/17/17  3:53 PM  Result Value Ref Range   Color, UA Amber    Clarity, UA Cloudy    Glucose, UA neg    Bilirubin, UA small    Ketones, UA neg    Spec Grav, UA 1.025 1.010 - 1.025   Blood, UA moderate    pH, UA 6.0 5.0 - 8.0   Protein, UA 30 mg/dl    Urobilinogen, UA 1.0 0.2 or 1.0 E.U./dL   Nitrite, UA neg    Leukocytes, UA  Trace (A) Negative  POCT urine pregnancy     Status: Abnormal   Collection Time: 08/17/17  3:55 PM  Result Value Ref Range   Preg Test, Ur Positive (A) Negative  CBC with Differential/Platelet     Status: None   Collection Time: 08/17/17  4:06 PM  Result Value Ref Range   WBC 10.8 3.8 - 10.8 Thousand/uL   RBC 4.65 3.80 - 5.10 Million/uL   Hemoglobin 13.2 11.7 - 15.5 g/dL   HCT 16.1 09.6 - 04.5 %   MCV 84.5 80.0 - 100.0 fL   MCH 28.4 27.0 - 33.0 pg   MCHC 33.6 32.0 - 36.0 g/dL   RDW 40.9 81.1 - 91.4 %   Platelets 393 140 - 400 Thousand/uL   MPV 11.2 7.5 - 12.5 fL   Neutro Abs 6,199 1,500 - 7,800 cells/uL   Lymphs Abs 3,197 850 - 3,900 cells/uL   WBC mixed population 907 200 - 950 cells/uL   Eosinophils Absolute 356 15 - 500 cells/uL   Basophils Absolute 140 0 - 200 cells/uL   Neutrophils Relative % 57.4 %   Total Lymphocyte 29.6 %   Monocytes Relative 8.4 %   Eosinophils Relative 3.3 %   Basophils Relative 1.3 %  Comprehensive metabolic panel     Status: None   Collection Time: 08/17/17  4:06 PM  Result Value Ref Range   Glucose, Bld 97 65 - 99 mg/dL    Comment: .            Fasting reference interval .    BUN 8 7 - 25 mg/dL   Creat 7.82 9.56 - 2.13 mg/dL   BUN/Creatinine Ratio NOT APPLICABLE 6 - 22 (calc)   Sodium 138 135 - 146 mmol/L   Potassium 3.9 3.5 - 5.3 mmol/L   Chloride 103 98 - 110 mmol/L   CO2 24 20 - 32 mmol/L   Calcium 9.7 8.6 - 10.2 mg/dL   Total Protein 7.2 6.1 - 8.1 g/dL   Albumin 4.4 3.6 - 5.1 g/dL   Globulin 2.8 1.9 - 3.7 g/dL (calc)   AG Ratio 1.6 1.0 - 2.5 (calc)   Total Bilirubin 0.6 0.2 - 1.2 mg/dL   Alkaline phosphatase (APISO) 60 33 - 115 U/L   AST 16 10 - 30 U/L   ALT 15 6 - 29 U/L  Hemoglobin A1c  Status: None   Collection Time: 08/17/17  4:06 PM  Result Value Ref Range   Hgb A1c MFr Bld 5.3 <5.7 % of total Hgb    Comment: For the purpose of screening for the presence of diabetes: . <5.7%       Consistent with the absence of  diabetes 5.7-6.4%    Consistent with increased risk for diabetes             (prediabetes) > or =6.5%  Consistent with diabetes . This assay result is consistent with a decreased risk of diabetes. . Currently, no consensus exists regarding use of hemoglobin A1c for diagnosis of diabetes in children. . According to American Diabetes Association (ADA) guidelines, hemoglobin A1c <7.0% represents optimal control in non-pregnant diabetic patients. Different metrics may apply to specific patient populations.  Standards of Medical Care in Diabetes(ADA). .    Mean Plasma Glucose 105 (calc)   eAG (mmol/L) 5.8 (calc)  Lipase     Status: None   Collection Time: 08/17/17  4:06 PM  Result Value Ref Range   Lipase 13 7 - 60 U/L  VITAMIN D 25 Hydroxy (Vit-D Deficiency, Fractures)     Status: Abnormal   Collection Time: 08/17/17  4:06 PM  Result Value Ref Range   Vit D, 25-Hydroxy 17 (L) 30 - 100 ng/mL    Comment: Vitamin D Status         25-OH Vitamin D: . Deficiency:                    <20 ng/mL Insufficiency:             20 - 29 ng/mL Optimal:                 > or = 30 ng/mL . For 25-OH Vitamin D testing on patients on  D2-supplementation and patients for whom quantitation  of D2 and D3 fractions is required, the [facility](TM) 25-OH VIT D, (D2,D3), LC/MS/MS is recommended: order  code [id number] (patients >428yrs). . For more information on this test, go to: http://education.[facility].com/faq/FAQ163 (This link is being provided for  informational/educational purposes only.)   B-HCG Quant     Status: None   Collection Time: 08/17/17  4:06 PM  Result Value Ref Range   HCG, Total, QN 101 mIU/mL    Comment: Gestational Age   Expected hCG values (mIU/mL) <1 Week:                 5-50 1-2 Weeks:               50-500 2-3 Weeks:               [phone] 3-4 Weeks:               500-10000 4-5 Weeks:               1000-50000 5-6 Weeks:               10000-100000 6-8 Weeks:                15000-200000 2-3 Months:              10000-100000 The table above provides only a very rough estimate of gestational age and should be used only in conjunction with other methods for establishing gestational age. Much more reliable and accurate estimations of gestational age may be obtained by using LMP or ultrasound. . . Values from different assay methods may vary.  The use of this assay to monitor or to diagnose  patients with cancer or any condition unrelated to pregnancy has not been cleared or approved by the FDA or the manufacturer of the assay.    US Ob Comp Less 14 Wks  Result Date: 08/17/2017 CLINICAL DATA:  Nausea and vomiting with abdominal pain. Positive pregnancy test. EXAM: OBSTETRIC <14 WK [place] AND TRANSVAGINAL OB US TECHNIQUE: Both transabdominal and transvaginal ultrasound examinations were performed for complete evaluation of the gestation as well as the maternal uterus, adnexal regions, and pelvic cul-de-sac. Transvaginal technique was performed to assess early pregnancy. COMPARISON:  None. FINDINGS: Intrauterine gestational sac: Not visualized. Subchorionic hemorrhage:  None visualized. Maternal uterus/adnexae: Maternal ovaries unremarkable. No adnexal mass. No intraperitoneal free fluid. IMPRESSION: No evidence for intrauterine gestation. Given the history of a positive pregnancy test, differential considerations include intrauterine gestation too early to visualize, completed abortion, or nonvisualized ectopic pregnancy. Close clinical correlation is recommended with serial beta-hCG and followup ultrasound as warranted. Electronically Signed   By: Kennith Center M.D.   On: 08/17/2017 20:09   US Ob Transvaginal  Result Date: 08/17/2017 CLINICAL DATA:  Nausea and vomiting with abdominal pain. Positive pregnancy test. EXAM: OBSTETRIC <14 WK [place] AND TRANSVAGINAL OB US TECHNIQUE: Both transabdominal and transvaginal ultrasound examinations were performed for complete  evaluation of the gestation as well as the maternal uterus, adnexal regions, and pelvic cul-de-sac. Transvaginal technique was performed to assess early pregnancy. COMPARISON:  None. FINDINGS: Intrauterine gestational sac: Not visualized. Subchorionic hemorrhage:  None visualized. Maternal uterus/adnexae: Maternal ovaries unremarkable. No adnexal mass. No intraperitoneal free fluid. IMPRESSION: No evidence for intrauterine gestation. Given the history of a positive pregnancy test, differential considerations include intrauterine gestation too early to visualize, completed abortion, or nonvisualized ectopic pregnancy. Close clinical correlation is recommended with serial beta-hCG and followup ultrasound as warranted. Electronically Signed   By: Kennith Center M.D.   On: 08/17/2017 20:09      Assessment and Plan: 21 y.o. female with   1. Positive blood pregnancy test - positive UPT and quant corresponding to approximately 1-2 weeks . Unable to visualize intrauterine sac on ultrasound. No uterine bleeding on exam. Low suspicion of ectopic. Serial B-HCG in 48 hours - daily prenatal vitamin - follow-up with OB in approximately 8 weeks - B-HCG Quant  2. Vaginal discharge - C. trachomatis/N. gonorrhoeae RNA - SureSwab, T.vaginalis RNA,Ql,Female - WET PREP FOR TRICH, YEAST, CLUE  3. Vitamin D deficiency - Ergocalciferol 2000 units CAPS; Take 1 capsule daily by mouth.  Dispense: 30 capsule; Refill: 11  Patient education and anticipatory guidance given Patient agrees with treatment plan Follow-up as needed if symptoms worsen or fail to improve  Levonne Hubert PA-C

## 2017-08-20 LAB — HCG, QUANTITATIVE, PREGNANCY: HCG, TOTAL, QN: 209 m[IU]/mL

## 2017-08-21 ENCOUNTER — Encounter: Payer: Self-pay | Admitting: Physician Assistant

## 2017-08-21 NOTE — Progress Notes (Signed)
Pregnancy hormone did double over the weekend. Patient is pregnant. She should start a prenatal vitamin daily. She should not take any medications other than those prescribed by our office. She will need an appointment with OB in approximately 8 weeks. I also want her to follow-up with Psychiatry for the duration of her pregnancy. There is no evidence of yeast, trichomonas, or BV on her tests The gonorrhea chlamydia tests are still pending, and the lab mishandled them, so there is a chance we won't get this result. Recommend she return in 1-2 weeks to repeat the test.  Follow-up if there is any fever, vaginal bleeding, or pelvic pain

## 2017-08-23 LAB — TEST AUTHORIZATION

## 2017-08-23 LAB — C. TRACHOMATIS/N. GONORRHOEAE RNA
C. trachomatis RNA, TMA: NOT DETECTED
N. GONORRHOEAE RNA, TMA: NOT DETECTED

## 2017-08-23 LAB — TRICHOMONAS VAGINALIS, PROBE AMP: Trichomonas vaginalis RNA: NOT DETECTED

## 2017-08-23 NOTE — Progress Notes (Signed)
No gonorrhea or chlamydia Vaginal discharge is physiologic

## 2017-09-04 ENCOUNTER — Encounter: Payer: Self-pay | Admitting: *Deleted

## 2017-09-07 ENCOUNTER — Ambulatory Visit: Payer: Self-pay | Admitting: Family Medicine

## 2017-09-08 ENCOUNTER — Ambulatory Visit (INDEPENDENT_AMBULATORY_CARE_PROVIDER_SITE_OTHER): Payer: Self-pay | Admitting: Family Medicine

## 2017-09-08 ENCOUNTER — Encounter: Payer: Self-pay | Admitting: Family Medicine

## 2017-09-08 VITALS — BP 138/85 | HR 71 | Ht 64.0 in | Wt 225.0 lb

## 2017-09-08 DIAGNOSIS — O210 Mild hyperemesis gravidarum: Secondary | ICD-10-CM

## 2017-09-08 DIAGNOSIS — Z8759 Personal history of other complications of pregnancy, childbirth and the puerperium: Secondary | ICD-10-CM

## 2017-09-08 DIAGNOSIS — Z3A01 Less than 8 weeks gestation of pregnancy: Secondary | ICD-10-CM

## 2017-09-08 DIAGNOSIS — O161 Unspecified maternal hypertension, first trimester: Secondary | ICD-10-CM

## 2017-09-08 MED ORDER — PYRIDOXINE HCL 25 MG PO TABS: 25.0000 mg | ORAL_TABLET | Freq: Four times a day (QID) | ORAL | 0 refills | Status: DC | PRN

## 2017-09-08 NOTE — Progress Notes (Signed)
Clay Guhl is a 21 y.o. female who [name] today for nausea and vomiting.  Ms. Wilkie AyeHorton is approximately [redacted] weeks pregnant and has developed nausea and vomiting that she associates with morning sickness.  She denies any abdominal pain or diarrhea.  She was seen in the emergency department yesterday where she was diagnosed with a urinary tract infection as well as vomiting.  She was given IV fluids IV ceftriaxone, and IV Zofran.  She was prescribed oral Zofran and rectal suppository Phenergan and Keflex which she has not picked up from the pharmacy yet.  She is not sure if she is going to keep this baby and has not yet started prenatal vitamins yet.  She has not yet established for prenatal care. She notes her current medical problem of hypertension and a prior history of preeclampsia with previous pregnancy.   Past Medical History:  Diagnosis Date  . Acute pyelonephritis 07/19/2016  . Anemia   . Depression   . Epilepsy (HCC)   . Genital herpes   . History of acute PID   . Kidney infection   . Obesity   . Pelvic pain 02/13/2017  . Pre-eclampsia   . Sexually transmitted disease   . Sickle cell trait (HCC)   . Stutter    Past Surgical History:  Procedure Laterality Date  . CESAREAN SECTION    . CESAREAN SECTION     Social History   Tobacco Use  . Smoking status: Never Smoker  . Smokeless tobacco: Never Used  Substance Use Topics  . Alcohol use: Yes    Comment: 1 day a week   family history includes Asthma in her mother; Hypertension in her maternal aunt and mother.  ROS as above:  Medications: Current Outpatient Medications  Medication Sig Dispense Refill  . cephALEXin (KEFLEX) 500 MG capsule Take 1 capsule (500 mg total) 2 (two) times daily by mouth. 14 capsule 0  . cephALEXin (KEFLEX) 500 MG capsule Take by mouth.    . Ergocalciferol  2000 units CAPS Take 1 capsule daily by mouth. 30 capsule 11  . hydrochlorothiazide (HYDRODIURIL) 25 MG tablet Take 1 tablet by mouth daily.    . ondansetron (ZOFRAN-ODT) 4 MG disintegrating tablet Take by mouth.    . promethazine (PHENERGAN) 25 MG suppository Place rectally.    . sertraline (ZOLOFT) 25 MG tablet Take 1 tablet (25 mg total) daily for 7 days by mouth, THEN 2 tablets (50 mg total) daily for 23 days. 60 tablet 0  . traZODone (DESYREL) 100 MG tablet Take 1 tablet (100 mg total) by mouth at bedtime. 30 tablet 0  . pyridOXINE (VITAMIN B-6) 25 MG tablet Take 1 tablet (25 mg total) by mouth every 6 (six) hours as needed (nausea or vomiting). 120 tablet 0   No current facility-administered medications for this visit.    Allergies  Allergen Reactions  . Phentermine Hcl Other (See Comments)    Tachycardia, Psychosis    Health Maintenance Health Maintenance  Topic Date Due  . TETANUS/TDAP  03/28/2015  . PAP SMEAR  03/27/2017  . INFLUENZA VACCINE  08/10/2018 (Originally 05/10/2017)  . CHLAMYDIA SCREENING  02/10/2018  . HIV Screening  Completed     Exam:  BP 138/85   Pulse 71   Ht 5\' 4"  (1.626 m)   Wt 225 lb (102.1 kg)   BMI 38.62 kg/m  Gen: Well NAD non-toxic appearing HEENT: EOMI,  MMM Lungs:  Normal work of breathing. CTABL Heart: RRR no MRG Abd: NABS, Soft. Nondistended, Nontender Exts: Brisk capillary refill, warm and well perfused.    No results found for this or any previous visit (from the past 72 hour(s)). No results found.    Assessment and Plan: 21 y.o. female with morning sickness.  Appears to be well-hydrated.  Plan to use Zofran and Phenergan as a backup and take vitamin B6 with doxylamine orally for morning sickness.  Establish care with OB/GYN in the suture and start taking at least folic acid for prenatal vitamin protection.  Continue hydrochlorothiazide for hypertension.   Orders Placed This Encounter  Procedures  . Ambulatory referral to  Obstetrics / Gynecology    Referral Priority:   Routine    Referral Type:   Consultation    Referral Reason:   Specialty Services Required    Requested Specialty:   Obstetrics and Gynecology    Number of Visits Requested:   1   Meds ordered this encounter  Medications  . pyridOXINE (VITAMIN B-6) 25 MG tablet    Sig: Take 1 tablet (25 mg total) by mouth every 6 (six) hours as needed (nausea or vomiting).    Dispense:  120 tablet    Refill:  0     Discussed warning signs or symptoms. Please see discharge instructions. Patient expresses understanding.  I spent 25 minutes with this patient, greater than 50% was face-to-face time counseling regarding ddx and treatment plan.

## 2017-09-08 NOTE — Patient Instructions (Signed)
Thank you for coming in today. Start B6 up to 4x dialy.  Take it with Doxalamine (over the counter Unisom) at bedtime.  Take prescribed Ondansetron and Promethazine as needed for nausea.  Follow up with OBGYN.  Recheck sooner if needed.,   Try to get a few swallows every 15-20 mins of fluid if nauseated.

## 2017-09-13 ENCOUNTER — Encounter (HOSPITAL_COMMUNITY): Payer: Self-pay | Admitting: *Deleted

## 2017-09-13 ENCOUNTER — Inpatient Hospital Stay (HOSPITAL_COMMUNITY)
Admission: AD | Admit: 2017-09-13 | Discharge: 2017-09-13 | Disposition: A | Discharge status: Home / Self Care | Payer: Self-pay | Source: Ambulatory Visit | Attending: Family Medicine | Admitting: Family Medicine

## 2017-09-13 DIAGNOSIS — O99611 Diseases of the digestive system complicating pregnancy, first trimester: Secondary | ICD-10-CM | POA: Insufficient documentation

## 2017-09-13 DIAGNOSIS — Z3A08 8 weeks gestation of pregnancy: Secondary | ICD-10-CM | POA: Insufficient documentation

## 2017-09-13 DIAGNOSIS — O219 Vomiting of pregnancy, unspecified: Secondary | ICD-10-CM

## 2017-09-13 DIAGNOSIS — O26891 Other specified pregnancy related conditions, first trimester: Secondary | ICD-10-CM

## 2017-09-13 DIAGNOSIS — Z3A01 Less than 8 weeks gestation of pregnancy: Secondary | ICD-10-CM

## 2017-09-13 DIAGNOSIS — R12 Heartburn: Secondary | ICD-10-CM | POA: Insufficient documentation

## 2017-09-13 DIAGNOSIS — O210 Mild hyperemesis gravidarum: Secondary | ICD-10-CM | POA: Insufficient documentation

## 2017-09-13 LAB — COMPREHENSIVE METABOLIC PANEL
ALBUMIN: 4.2 g/dL (ref 3.5–5.0)
ALT: 14 U/L (ref 14–54)
ANION GAP: 9 (ref 5–15)
AST: 17 U/L (ref 15–41)
Alkaline Phosphatase: 58 U/L (ref 38–126)
BILIRUBIN TOTAL: 0.6 mg/dL (ref 0.3–1.2)
BUN: 8 mg/dL (ref 6–20)
CHLORIDE: 99 mmol/L — AB (ref 101–111)
CO2: 26 mmol/L (ref 22–32)
Calcium: 9.4 mg/dL (ref 8.9–10.3)
Creatinine, Ser: 0.55 mg/dL (ref 0.44–1.00)
GFR calc Af Amer: >60 mL/min (ref >60)
GFR calc non Af Amer: >60 mL/min (ref >60)
GLUCOSE: 81 mg/dL (ref 65–99)
POTASSIUM: 3.7 mmol/L (ref 3.5–5.1)
SODIUM: 134 mmol/L — AB (ref 135–145)
TOTAL PROTEIN: 7.3 g/dL (ref 6.5–8.1)

## 2017-09-13 LAB — CBC
HCT: 37.7 % (ref 36.0–46.0)
HEMOGLOBIN: 12.5 g/dL (ref 12.0–15.0)
MCH: 28.8 pg (ref 26.0–34.0)
MCHC: 33.2 g/dL (ref 30.0–36.0)
MCV: 86.9 fL (ref 78.0–100.0)
PLATELETS: 346 10*3/uL (ref 150–400)
RBC: 4.34 MIL/uL (ref 3.87–5.11)
RDW: 13.7 % (ref 11.5–15.5)
WBC: 8.8 10*3/uL (ref 4.0–10.5)

## 2017-09-13 LAB — URINALYSIS, ROUTINE W REFLEX MICROSCOPIC
Bilirubin Urine: NEGATIVE
Glucose, UA: NEGATIVE mg/dL
Hgb urine dipstick: NEGATIVE
Ketones, ur: 20 mg/dL — AB
Nitrite: NEGATIVE
PH: 6 (ref 5.0–8.0)
Protein, ur: 30 mg/dL — AB
SPECIFIC GRAVITY, URINE: 1.021 (ref 1.005–1.030)

## 2017-09-13 MED ORDER — PANTOPRAZOLE SODIUM 40 MG IV SOLR
40.0000 mg | Freq: Once | INTRAVENOUS | Status: AC
  Administered 2017-09-13: 40 mg via INTRAVENOUS
  Filled 2017-09-13: qty 40

## 2017-09-13 MED ORDER — METOCLOPRAMIDE HCL 10 MG PO TABS: 10.0000 mg | ORAL_TABLET | Freq: Four times a day (QID) | ORAL | 0 refills | Status: DC

## 2017-09-13 MED ORDER — ONDANSETRON HCL 4 MG/2ML IJ SOLN
4.0000 mg | Freq: Once | INTRAMUSCULAR | Status: AC
  Administered 2017-09-13: 4 mg via INTRAVENOUS
  Filled 2017-09-13: qty 2

## 2017-09-13 MED ORDER — DEXTROSE 5 % IN LACTATED RINGERS IV BOLUS
1000.0000 mL | Freq: Once | INTRAVENOUS | Status: AC
  Administered 2017-09-13: 1000 mL via INTRAVENOUS

## 2017-09-13 MED ORDER — OMEPRAZOLE 20 MG PO CPDR: 20.0000 mg | DELAYED_RELEASE_CAPSULE | Freq: Every day | ORAL | 0 refills | Status: DC

## 2017-09-13 NOTE — MAU Provider Note (Signed)
History     CSN: [id number]  Arrival date and time: 09/13/17 1439   First Provider Initiated Contact with Patient 09/13/17 1721      Chief Complaint  Patient presents with  . Emesis   HPI Connie Shea is a 21 y.o. G2P0101 at [id number] who presents with n/v & epigastric pain. Symptoms began 2 months ago but have worsened since finding out she was pregnant. Went to her PCP & was given rx for B6 & phenergan suppositories but states they don't work. Has not started prenatal care yet.  Reports that she has not been able to keep down any food or fluids x 3 weeks. Reports vomiting 4 times today. Initially had episodes of constipation but has had 3 episodes of watery stool in the last 24 hours. Epigastric pain is constant & describes as sharp & sore. Rates pain 9/10. Pain does not radiate. Nothing makes pain better or worse. Has not treated symptoms. Denies lower abdominal pain, fever/chills, or vaginal bleeding.   OB History    Gravida Para Term Preterm AB Living   2 1   1   1    SAB TAB Ectopic Multiple Live Births           1      Past Medical History:  Diagnosis Date  . Acute pyelonephritis 07/19/2016  . Anemia   . Depression   . Genital herpes   . History of acute PID   . Kidney infection   . Obesity   . Pelvic pain 02/13/2017  . Pre-eclampsia   . Sexually transmitted disease   . Sickle cell trait (HCC)   . Stutter     Past Surgical History:  Procedure Laterality Date  . CESAREAN SECTION    . CESAREAN SECTION      Family History  Problem Relation Age of Onset  . Hypertension Mother   . Asthma Mother   . Hypertension Maternal Aunt     Social History   Tobacco Use  . Smoking status: Never Smoker  . Smokeless tobacco: Never Used  Substance Use Topics  . Alcohol use: Yes    Comment: 1 day a week  . Drug use: No    Allergies:  Allergies  Allergen Reactions  . Phentermine Hcl Other (See Comments)    Tachycardia, Psychosis    Medications Prior to Admission   Medication Sig Dispense Refill Last Dose  . cephALEXin (KEFLEX) 500 MG capsule Take by mouth.   09/13/2017 at Unknown time  . hydrochlorothiazide (HYDRODIURIL) 25 MG tablet Take 1 tablet by mouth daily.   09/12/2017 at Unknown time  . promethazine (PHENERGAN) 25 MG suppository Place 25 mg rectally at bedtime as needed for nausea or vomiting.    09/12/2017 at Unknown time  . cephALEXin (KEFLEX) 500 MG capsule Take 1 capsule (500 mg total) 2 (two) times daily by mouth. (Patient not taking: Reported on 09/13/2017) 14 capsule 0 Not Taking at Unknown time  . Ergocalciferol 2000 units CAPS Take 1 capsule daily by mouth. (Patient not taking: Reported on 09/13/2017) 30 capsule 11 Not Taking at Unknown time  . pyridOXINE (VITAMIN B-6) 25 MG tablet Take 1 tablet (25 mg total) by mouth every 6 (six) hours as needed (nausea or vomiting). (Patient not taking: Reported on 09/13/2017) 120 tablet 0 Not Taking at Unknown time  . sertraline (ZOLOFT) 25 MG tablet Take 1 tablet (25 mg total) daily for 7 days by mouth, THEN 2 tablets (50 mg total) daily for 23 days. (  Patient not taking: Reported on 09/13/2017) 60 tablet 0 Not Taking at Unknown time  . traZODone (DESYREL) 100 MG tablet Take 1 tablet (100 mg total) by mouth at bedtime. 30 tablet 0 09/11/2017    Review of Systems  Constitutional: Negative.   Gastrointestinal: Positive for abdominal pain, diarrhea, nausea and vomiting. Negative for abdominal distention and blood in stool.  Genitourinary: Negative.    Physical Exam   Blood pressure (!) 123/58, pulse 83, temperature 98.4 F (36.9 C), temperature source Oral, resp. rate 16, height 5\' 4"  (1.626 m), weight 218 lb (98.9 kg), last menstrual period 07/19/2017, SpO2 100 %.  Physical Exam  Nursing note and vitals reviewed. Constitutional: She is oriented to person, place, and time. She appears well-developed and well-nourished. No distress.  HENT:  Head: Normocephalic and atraumatic.  Eyes: Conjunctivae are  normal. Right eye exhibits no discharge. Left eye exhibits no discharge. No scleral icterus.  Neck: Normal range of motion.  Cardiovascular: Normal rate, regular rhythm and normal heart sounds.  No murmur heard. Respiratory: Effort normal and breath sounds normal. No respiratory distress. She has no wheezes.  GI: Soft. Bowel sounds are normal. There is tenderness in the epigastric area. There is no rigidity, no rebound and no guarding.  Neurological: She is alert and oriented to person, place, and time.  Skin: Skin is warm and dry. She is not diaphoretic.  Psychiatric: She has a normal mood and affect. Her behavior is normal. Judgment and thought content normal.    MAU Course  Procedures Results for orders placed or performed during the hospital encounter of 09/13/17 (from the past 24 hour(s))  Urinalysis, Routine w reflex microscopic     Status: Abnormal   Collection Time: 09/13/17  3:40 PM  Result Value Ref Range   Color, Urine YELLOW YELLOW   APPearance CLOUDY (A) CLEAR   Specific Gravity, Urine 1.021 1.005 - 1.030   pH 6.0 5.0 - 8.0   Glucose, UA NEGATIVE NEGATIVE mg/dL   Hgb urine dipstick NEGATIVE NEGATIVE   Bilirubin Urine NEGATIVE NEGATIVE   Ketones, ur 20 (A) NEGATIVE mg/dL   Protein, ur 30 (A) NEGATIVE mg/dL   Nitrite NEGATIVE NEGATIVE   Leukocytes, UA TRACE (A) NEGATIVE   RBC / HPF 0-5 0 - 5 RBC/hpf   WBC, UA 6-30 0 - 5 WBC/hpf   Bacteria, UA RARE (A) NONE SEEN   Squamous Epithelial / LPF 6-30 (A) NONE SEEN   Mucus PRESENT   CBC     Status: None   Collection Time: 09/13/17  5:49 PM  Result Value Ref Range   WBC 8.8 4.0 - 10.5 K/uL   RBC 4.34 3.87 - 5.11 MIL/uL   Hemoglobin 12.5 12.0 - 15.0 g/dL   HCT 40.937.7 81.136.0 - 91.446.0 %   MCV 86.9 78.0 - 100.0 fL   MCH 28.8 26.0 - 34.0 pg   MCHC 33.2 30.0 - 36.0 g/dL   RDW 78.213.7 95.611.5 - 21.315.5 %   Platelets 346 150 - 400 K/uL  Comprehensive metabolic panel     Status: Abnormal   Collection Time: 09/13/17  5:49 PM  Result Value Ref  Range   Sodium 134 (L) 135 - 145 mmol/L   Potassium 3.7 3.5 - 5.1 mmol/L   Chloride 99 (L) 101 - 111 mmol/L   CO2 26 22 - 32 mmol/L   Glucose, Bld 81 65 - 99 mg/dL   BUN 8 6 - 20 mg/dL   Creatinine, Ser 0.860.55 0.44 - 1.00 mg/dL  Calcium 9.4 8.9 - 10.3 mg/dL   Total Protein 7.3 6.5 - 8.1 g/dL   Albumin 4.2 3.5 - 5.0 g/dL   AST 17 15 - 41 U/L   ALT 14 14 - 54 U/L   Alkaline Phosphatase 58 38 - 126 U/L   Total Bilirubin 0.6 0.3 - 1.2 mg/dL   GFR calc non Af Amer >60 >60 mL/min   GFR calc Af Amer >60 >60 mL/min   Anion gap 9 5 - 15    MDM IV fluid bolus with zofran 4 mg & protonix 40 mg CBC, CMP Pt not observed vomiting in MAU Reports improvement in symptoms  Assessment and Plan  A: 1. Nausea and vomiting during pregnancy prior to [redacted] weeks gestation   2. Heartburn during pregnancy in first trimester   3. [redacted] weeks gestation of pregnancy    P: Discharge home Rx reglan & prilosec F/u with PCP Start prenatal care Discussed reasons to return to MAU  Judeth Horn 09/13/2017, 5:21 PM

## 2017-09-13 NOTE — MAU Note (Signed)
Pt not in lobby.  

## 2017-09-13 NOTE — MAU Note (Signed)
Pt states she has been vomiting everything she eats for months, states it was happening before she was pregnant but it is worse now.

## 2017-09-13 NOTE — MAU Note (Signed)
Pt asked again about this going on before she was pregnant and asked if she had seen a specialist. She said she has seen her PCP but they haven't been able to figure out what was wrong and didn't refer her anywhere.

## 2017-09-13 NOTE — Discharge Instructions (Signed)
[facility]   Center for [facility]       Phone: [phone]  [facility] for [facility] Phone: [phone]  Center for [facility] [place]  Phone: [phone]  Center for [facility]  Phone: [phone]  [facility]  Phone: [phone]  [facility]       Phone: [phone]  [facility]    Phone: [phone]   [facility]    Phone: [phone]  [facility]    Phone: [phone]  [facility]    Phone: [phone]   [facility]  Phone: [phone]  [facility]    Phone: [phone]  [facility]   Phone: [phone]  [facility]    Phone: [phone]    Heartburn During Pregnancy Heartburn is a type of pain or discomfort that can happen in the throat or chest. It is often described as a burning sensation. Heartburn is common during pregnancy because:  A hormone (progesterone) that is released during pregnancy may relax the valve (lower esophageal sphincter, or LES) that separates the esophagus from the stomach. This allows stomach acid to move up into the esophagus, causing heartburn.  The uterus gets larger and pushes up on the stomach, which pushes more acid into the esophagus. This is especially true in the later stages of pregnancy.  Heartburn usually goes away or gets better after giving birth. What are the causes? Heartburn is caused by stomach acid backing up into the esophagus (reflux). Reflux can be triggered by:  Changing hormone levels.  Large meals.  Certain foods and beverages, such as coffee, chocolate, onions, and peppermint.  Exercise.  Increased stomach acid production.  What increases the  risk? You are more likely to experience heartburn during pregnancy if you:  Had heartburn prior to becoming pregnant.  Have been pregnant more than once before.  Are overweight or obese.  The likelihood that you will get heartburn also increases as you get farther along in your pregnancy, especially during the last trimester. What are the signs or symptoms? Symptoms of this condition include:  Burning pain in the chest or lower throat.  Bitter taste in the mouth.  Coughing.  Problems swallowing.  Vomiting.  Hoarse voice.  Asthma.  Symptoms may get worse when you lie down or bend over. Symptoms are often worse at night. How is this diagnosed? This condition is diagnosed based on:  Your medical history.  Your symptoms.  Blood tests to check for a certain type of bacteria associated with heartburn.  Whether taking heartburn medicine relieves your symptoms.  Examination of the stomach and esophagus using a tube with a light and camera on the end (endoscopy).  How is this treated? Treatment varies depending on how severe your symptoms are. Your health care provider may recommend:  Over-the-counter medicines (antacids or acid reducers) for mild heartburn.  Prescription medicines to decrease stomach acid or to protect your stomach lining.  Certain changes in your diet.  Raising the head of your bed so it is higher than the foot of the bed. This helps prevent stomach acid from backing up into the esophagus when you are lying down.  Follow these instructions at home: Eating and drinking  Do not drink alcohol during your pregnancy.  Identify foods and beverages that make your symptoms worse, and avoid  them.  Beverages that you may want to avoid include: ? Coffee and tea (with or without caffeine). ? Energy drinks and sports drinks. ? Carbonated drinks or sodas. ? Citrus fruit juices.  Foods that you may want to avoid include: ? Chocolate and  cocoa. ? Peppermint and mint flavorings. ? Garlic, onions, and horseradish. ? Spicy and acidic foods, including peppers, chili powder, curry powder, vinegar, hot sauces, and barbecue sauce. ? Citrus fruits, such as oranges, lemons, and limes. ? Tomato-based foods, such as red sauce, chili, and salsa. ? Fried and fatty foods, such as donuts, french fries, potato chips, and high-fat dressings. ? High-fat meats, such as hot dogs, cold cuts, sausage, ham, and bacon. ? High-fat dairy items, such as whole milk, butter, and cheese.  Eat small, frequent meals instead of large meals.  Avoid drinking large amounts of liquid with your meals.  Avoid eating meals during the 2-3 hours before bedtime.  Avoid lying down right after you eat.  Do not exercise right after you eat. Medicines  Take over-the-counter and prescription medicines only as told by your health care provider.  Do not take aspirin, ibuprofen, or other NSAIDs unless your health care provider tells you to do that.  You may be instructed to avoid medicines that contain sodium bicarbonate. General instructions  If directed, raise the head of your bed about 6 inches (15 cm) by putting blocks under the legs. Sleeping with more pillows does not effectively relieve heartburn because it only changes the position of your head.  Do not use any products that contain nicotine or tobacco, such as cigarettes and e-cigarettes. If you need help quitting, ask your health care provider.  Wear loose-fitting clothing.  Try to reduce your stress, such as with yoga or meditation. If you need help managing stress, ask your health care provider.  Maintain a healthy weight. If you are overweight, work with your health care provider to safely lose weight.  Keep all follow-up visits as told by your health care provider. This is important. Contact a health care provider if:  You develop new symptoms.  Your symptoms do not improve with  treatment.  You have unexplained weight loss.  You have difficulty swallowing.  You make loud sounds when you breathe (wheeze).  You have a cough that does not go away.  You have frequent heartburn for more than 2 weeks.  You have nausea or vomiting that does not get better with treatment.  You have pain in your abdomen. Get help right away if:  You have severe chest pain that spreads to your arm, neck, or jaw.  You feel sweaty, dizzy, or light-headed.  You have shortness of breath.  You have pain when swallowing.  You vomit, and your vomit looks like blood or coffee grounds.  Your stool is bloody or black. This information is not intended to replace advice given to you by your health care provider. Make sure you discuss any questions you have with your health care provider. Document Released: 09/23/2000 Document Revised: 06/13/2016 Document Reviewed: 06/13/2016 Elsevier Interactive Patient Education  2018 [employer]

## 2018-06-12 IMAGING — CT CT HEAD W/O CM
3 of 4 series · 15 of 47 positions shown, 18 images · non-contrast
Comparison: None.

CLINICAL DATA: Frontal and bitemporal headache for 2 weeks, some
blurred vision and dizziness

EXAM:
CT HEAD WITHOUT CONTRAST
TECHNIQUE: Contiguous axial images were obtained from the base of the skull
through the vertex without intravenous contrast.

[Series 4: head w/o · axial · non-contrast · 0.45mm/px · z∈[-137,-17]mm · 9 of 30 slices shown, 12 images]
[im 3/30  brain]
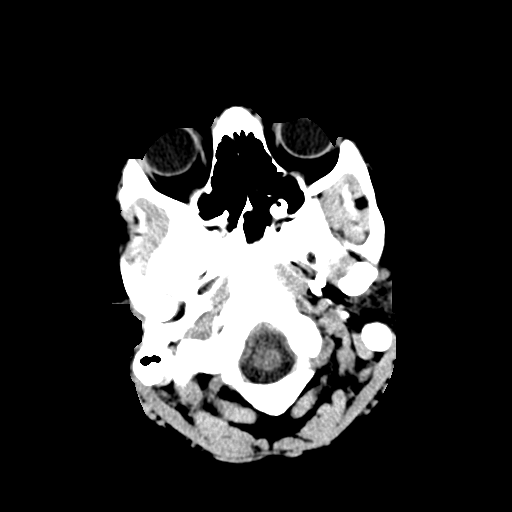
[im 3/30  bone]
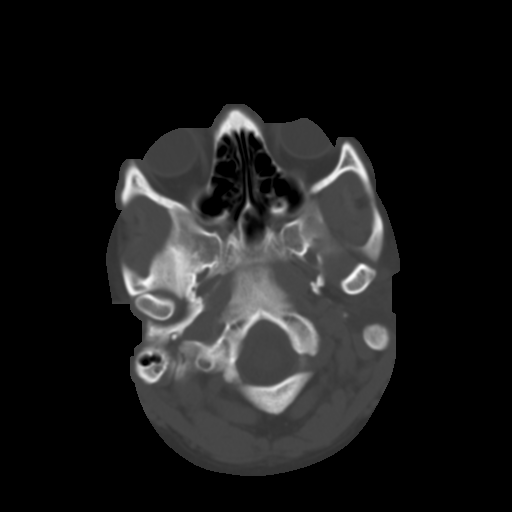
[im 7/30  brain]
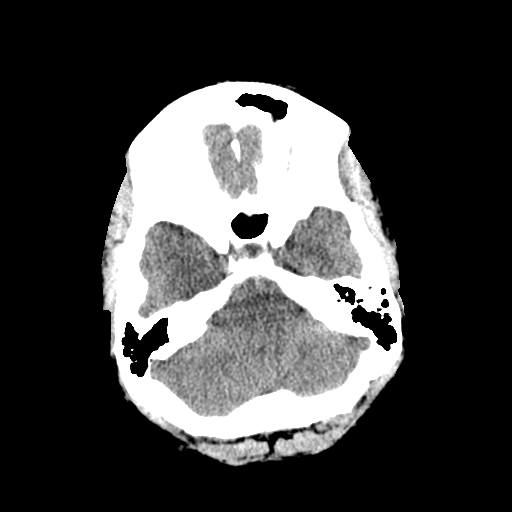
[im 9/30  brain]
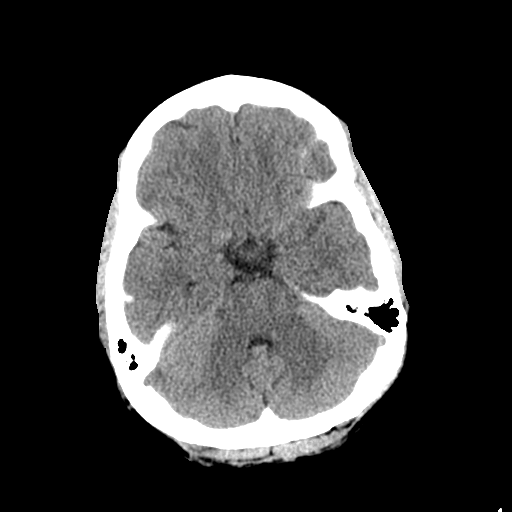
[im 13/30  brain]
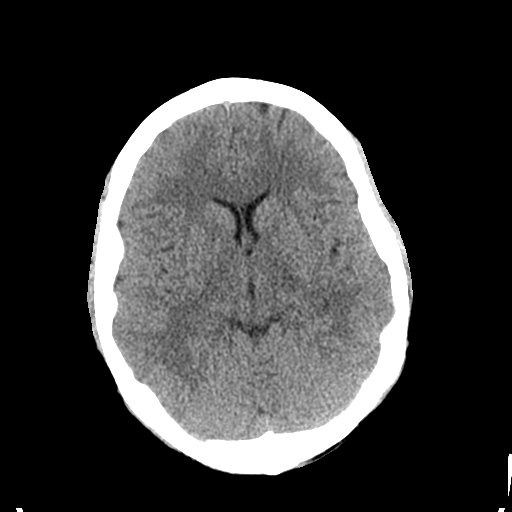
[im 15/30  brain]
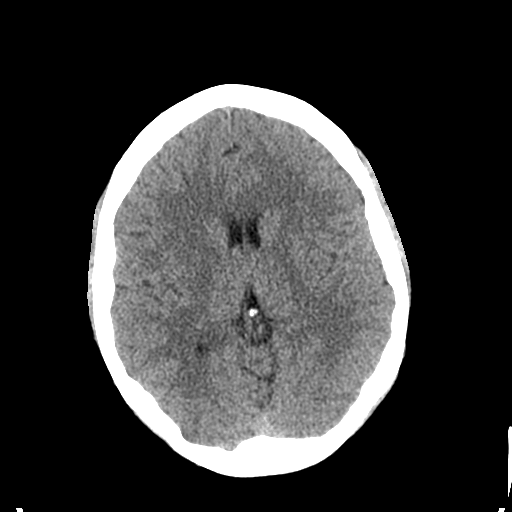
[im 15/30  bone]
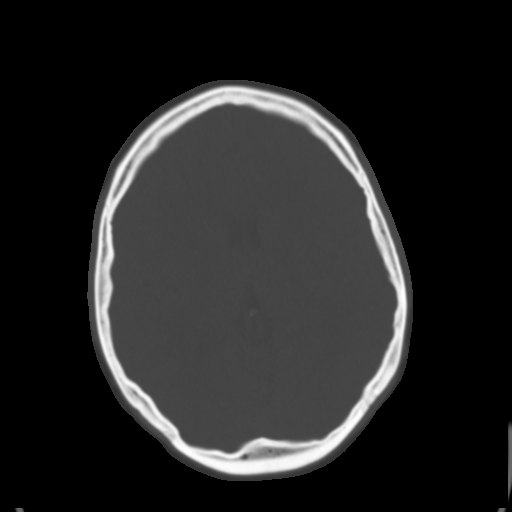
[im 17/30  brain]
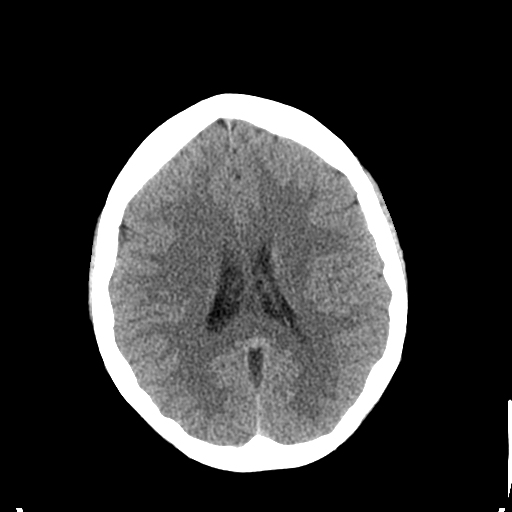
[im 21/30  brain]
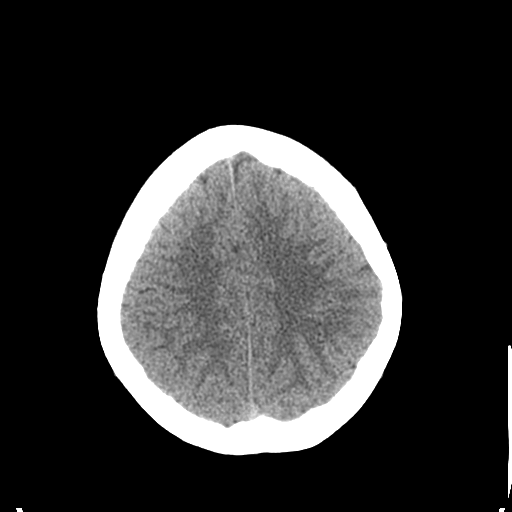
[im 23/30  brain]
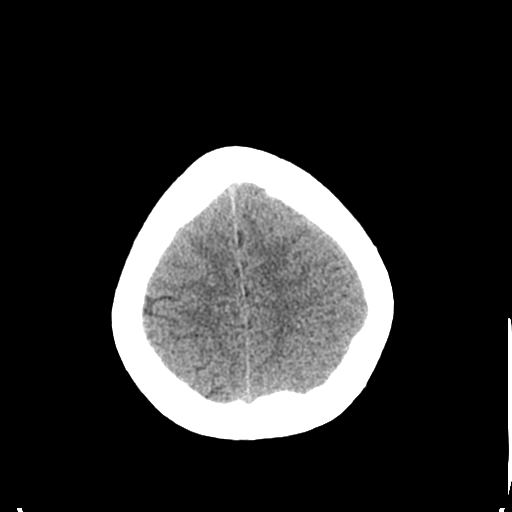
[im 27/30  brain]
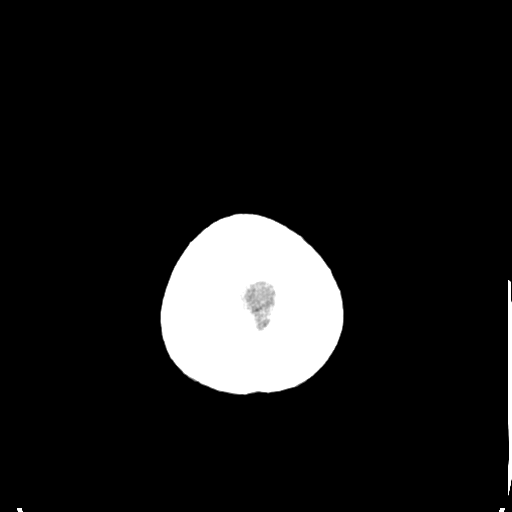
[im 27/30  bone]
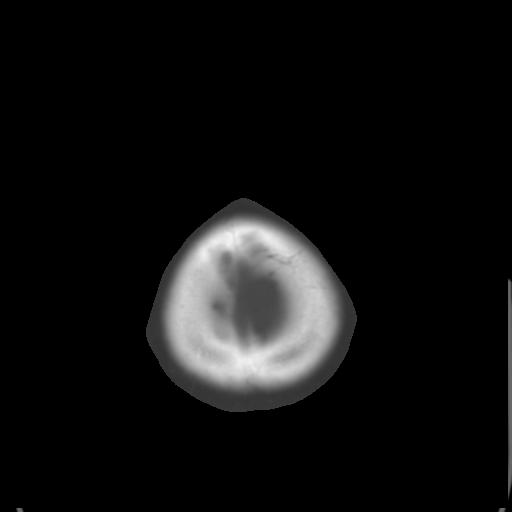

[Series 5: coronal · coronal · 0.30mm/px · 3 of 77 slices shown]
[im 26/77  brain]
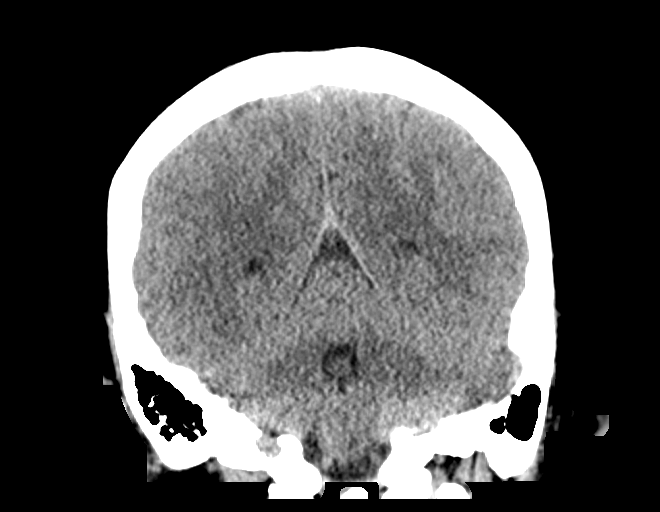
[im 34/77  brain]
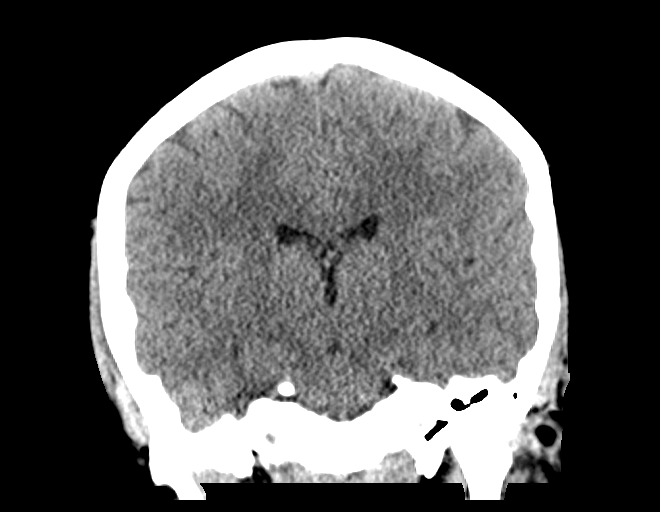
[im 43/77  brain]
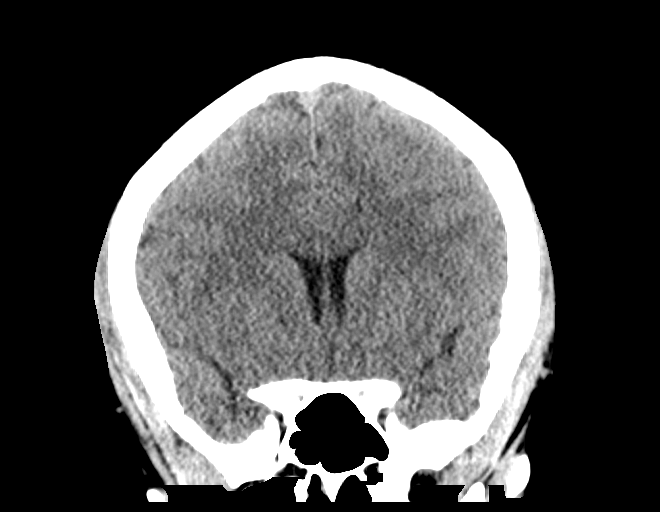

[Series 6: sagittal · sagittal · 0.32mm/px · 3 of 77 slices shown]
[im 26/77  brain]
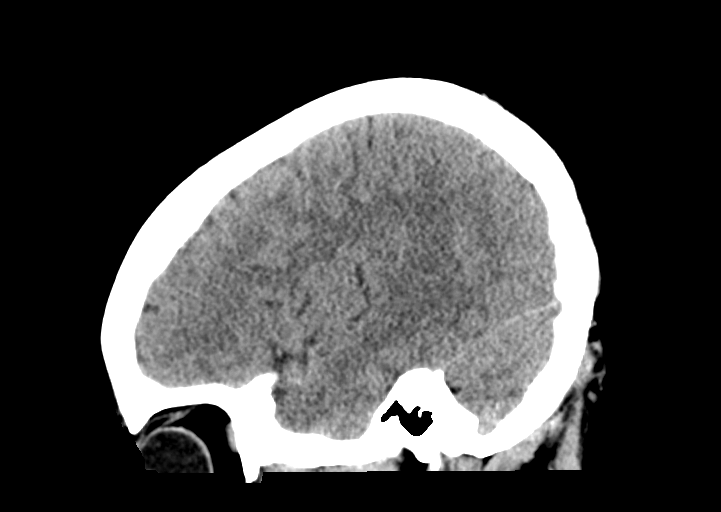
[im 39/77  brain]
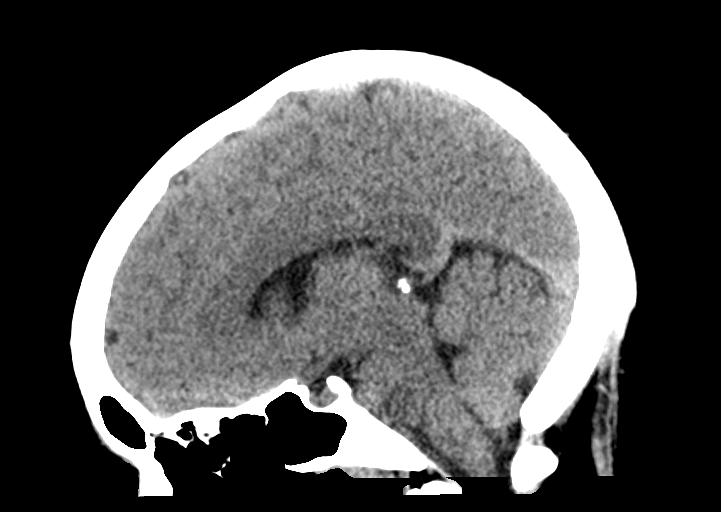
[im 51/77  brain]
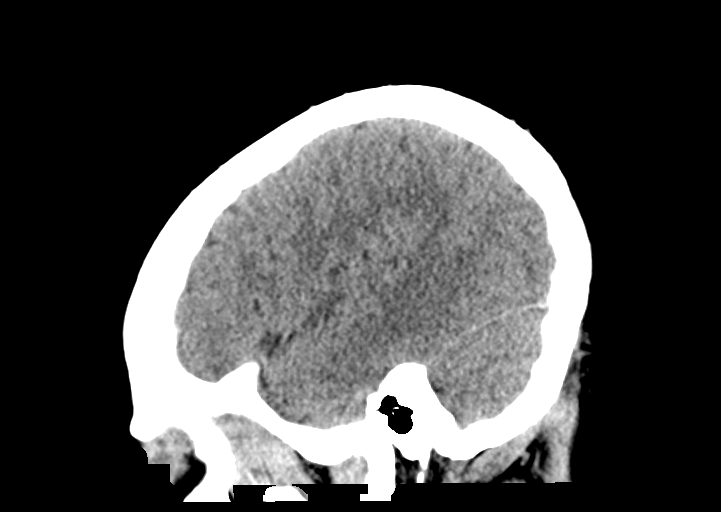

[15 of 47 positions shown; findings below may reference images not displayed]

FINDINGS: Brain: The ventricular system is normal in size and configuration
and the septum is in a normal midline position. The fourth ventricle
and basilar cisterns are unremarkable. No hemorrhage, mass lesion,
or acute infarction is seen.

Vascular: No vascular abnormality is noted on this unenhanced study.

Skull: On bone window images, no calvarial abnormality is seen.

Sinuses/Orbits: The paranasal sinuses appear clear.

Other: None.
IMPRESSION: Negative unenhanced CT of the brain.

## 2018-07-18 ENCOUNTER — Encounter (HOSPITAL_COMMUNITY): Payer: Self-pay

## 2018-07-23 ENCOUNTER — Emergency Department (HOSPITAL_COMMUNITY)
Admission: EM | Admit: 2018-07-23 | Discharge: 2018-07-23 | Disposition: A | Discharge status: Home / Self Care | Payer: Medicaid Other | Attending: Emergency Medicine | Admitting: Emergency Medicine

## 2018-07-23 ENCOUNTER — Emergency Department (HOSPITAL_COMMUNITY): Payer: Medicaid Other

## 2018-07-23 ENCOUNTER — Other Ambulatory Visit: Payer: Self-pay

## 2018-07-23 ENCOUNTER — Encounter (HOSPITAL_COMMUNITY): Payer: Self-pay | Admitting: Emergency Medicine

## 2018-07-23 DIAGNOSIS — I10 Essential (primary) hypertension: Secondary | ICD-10-CM | POA: Insufficient documentation

## 2018-07-23 DIAGNOSIS — R05 Cough: Secondary | ICD-10-CM | POA: Diagnosis present

## 2018-07-23 DIAGNOSIS — J069 Acute upper respiratory infection, unspecified: Secondary | ICD-10-CM

## 2018-07-23 DIAGNOSIS — B9789 Other viral agents as the cause of diseases classified elsewhere: Secondary | ICD-10-CM

## 2018-07-23 LAB — INFLUENZA PANEL BY PCR (TYPE A & B)
INFLAPCR: NEGATIVE
Influenza B By PCR: NEGATIVE

## 2018-07-23 MED ORDER — ACETAMINOPHEN 500 MG PO TABS
1000.0000 mg | ORAL_TABLET | Freq: Once | ORAL | Status: AC
  Administered 2018-07-23: 1000 mg via ORAL
  Filled 2018-07-23: qty 2

## 2018-07-23 NOTE — ED Notes (Signed)
Bed: WTR9 Expected date:  Expected time:  Means of arrival:  Comments: 

## 2018-07-23 NOTE — ED Triage Notes (Addendum)
Patient c/o cough, congestion, runny nose, body aches since yesterday. Denies N/V/D. States family has similar sx.

## 2018-07-23 NOTE — Discharge Instructions (Addendum)
Your symptoms are likely caused by a viral upper respiratory infection. Antibiotics are not helpful in treating viral infection, the virus should run its course in about 5-7 days. Please make sure you are drinking plenty of fluids. You can treat your symptoms supportively with tylenol/ibuprofen for fevers and pains, Zyrtec and Flonase to help with nasal congestion, and over the counter cough syrups and throat lozenges to help with cough. If your symptoms are not improving please follow up with you Primary doctor.   If you develop persistent fevers, shortness of breath or difficulty breathing, chest pain, severe headache and neck pain, persistent nausea and vomiting or other new or concerning symptoms return to the Emergency department.  

## 2018-07-23 NOTE — ED Provider Notes (Signed)
[facility]-EMERGENCY DEPT Provider Note   CSN: [id number] Arrival date & time: 07/23/18  1227     History   Chief Complaint Chief Complaint  Patient presents with  . Cough    HPI Connie Shea is a 22 y.o. female.  Connie Shea is a 22 y.o. Female with a history of bronchitis, sickle cell trait, and depression, who presents to the emergency department for evaluation of 2 days of cough, congestion, runny nose and body aches, with low-grade fever at home.  She reports symptoms started yesterday afternoon and were sudden in onset and have been constant and worsening.  She reports cough is occasionally productive.  She does have history of bronchitis and occasionally has to use an inhaler.  She denies any chest pain or shortness of breath, no abdominal pain, nausea or vomiting.  She has not taken anything to try and treat her symptoms prior to arrival and denies any other aggravating or alleviating factors.  No flu shot.  Her daughter is here with similar symptoms.     Past Medical History:  Diagnosis Date  . Acute pyelonephritis 07/19/2016  . Anemia   . Depression   . Genital herpes   . History of acute PID   . Kidney infection   . Obesity   . Pelvic pain 02/13/2017  . Pre-eclampsia   . Sexually transmitted disease   . Sickle cell trait (HCC)   . Stutter     Patient Active Problem List   Diagnosis Date Noted  . Positive urine pregnancy test 08/17/2017  . Decreased appetite 08/17/2017  . Hypertension goal BP (blood pressure) < 130/80 06/23/2017  . Moderate episode of recurrent major depressive disorder (HCC) 06/23/2017  . Neck and shoulder pain 06/23/2017  . Tachycardia 03/21/2017  . Abnormal weight gain during pregnancy, antepartum 03/16/2017  . Vitamin D deficiency 02/13/2017  . Severe obesity (BMI >= 40) (HCC) 02/10/2017  . Episodic tension-type headache, not intractable 02/10/2017  . Elevated LFTs 07/18/2016  . History of normocytic  normochromic anemia 07/18/2016    Past Surgical History:  Procedure Laterality Date  . CESAREAN SECTION    . CESAREAN SECTION       OB History    Gravida  2   Para  1   Term      Preterm  1   AB      Living  1     SAB      TAB      Ectopic      Multiple      Live Births  1            Home Medications    Prior to Admission medications   Medication Sig Start Date End Date Taking? Authorizing Provider  metoCLOPramide (REGLAN) 10 MG tablet Take 1 tablet (10 mg total) by mouth every 6 (six) hours. 09/13/17   Judeth Horn, NP  omeprazole (PRILOSEC) 20 MG capsule Take 1 capsule (20 mg total) by mouth daily. 09/13/17   Judeth Horn, NP  pyridOXINE (VITAMIN B-6) 25 MG tablet Take 1 tablet (25 mg total) by mouth every 6 (six) hours as needed (nausea or vomiting). Patient not taking: Reported on 09/13/2017 09/08/17   Rodolph Bong, MD  traZODone (DESYREL) 100 MG tablet Take 1 tablet (100 mg total) by mouth at bedtime. 03/23/17   Charm Rings, NP    Family History Family History  Problem Relation Age of Onset  . Hypertension Mother   .  Asthma Mother   . Hypertension Maternal Aunt     Social History Social History   Tobacco Use  . Smoking status: Never Smoker  . Smokeless tobacco: Never Used  Substance Use Topics  . Alcohol use: Yes    Comment: 1 day a week  . Drug use: No     Allergies   Phentermine hcl   Review of Systems Review of Systems  Constitutional: Positive for chills. Negative for fever.  HENT: Positive for congestion, rhinorrhea, sinus pressure, sneezing and sore throat. Negative for ear pain.   Eyes: Negative for discharge, redness and itching.  Respiratory: Positive for cough. Negative for chest tightness, shortness of breath and wheezing.   Cardiovascular: Negative for chest pain.  Gastrointestinal: Negative for abdominal pain, nausea and vomiting.  Genitourinary: Negative for dysuria.  Musculoskeletal: Positive for arthralgias  and myalgias. Negative for joint swelling, neck pain and neck stiffness.  Skin: Negative for color change and rash.  Neurological: Negative for headaches.     Physical Exam Updated Vital Signs BP (!) 139/98 (BP Location: Left Arm)   Pulse (!) 102   Temp 99.1 F (37.3 C) (Oral)   Resp 18   LMP 07/19/2018   SpO2 93%   Physical Exam  Constitutional: She is oriented to person, place, and time. She appears well-developed and well-nourished. No distress.  HENT:  Head: Normocephalic and atraumatic.  TMs clear with good landmarks, moderate nasal mucosa edema with clear rhinorrhea, posterior oropharynx clear and moist, with some erythema, no edema or exudates, uvula midline  Eyes: Right eye exhibits no discharge. Left eye exhibits no discharge.  Neck: Normal range of motion. Neck supple.  No rigidity  Cardiovascular: Normal rate, regular rhythm, normal heart sounds and intact distal pulses.  Pulmonary/Chest: Effort normal and breath sounds normal. No respiratory distress.  Respirations equal and unlabored, patient able to speak in full sentences, lungs clear to auscultation bilaterally  Abdominal: Soft. Bowel sounds are normal. She exhibits no distension and no mass. There is no tenderness. There is no guarding.  Musculoskeletal: She exhibits no edema or deformity.  Neurological: She is alert and oriented to person, place, and time. Coordination normal.  Skin: Skin is warm and dry. Capillary refill takes less than 2 seconds. No rash noted. She is not diaphoretic.  Psychiatric: She has a normal mood and affect. Her behavior is normal.  Nursing note and vitals reviewed.    ED Treatments / Results  Labs (all labs ordered are listed, but only abnormal results are displayed) Labs Reviewed  INFLUENZA PANEL BY PCR (TYPE A & B)    EKG None  Radiology Dg Chest 2 View  Result Date: 07/23/2018 CLINICAL DATA:  Cough. EXAM: CHEST - 2 VIEW COMPARISON:  Radiographs of July 18, 2016.  FINDINGS: The heart size and mediastinal contours are within normal limits. Both lungs are clear. The visualized skeletal structures are unremarkable. IMPRESSION: No active cardiopulmonary disease. Electronically Signed   By: Lupita Raider, M.D.   On: 07/23/2018 14:21    Procedures Procedures (including critical care time)  Medications Ordered in ED Medications  acetaminophen (TYLENOL) tablet 1,000 mg (1,000 mg Oral Given 07/23/18 1443)     Initial Impression / Assessment and Plan / ED Course  I have reviewed the triage vital signs and the nursing notes.  Pertinent labs & imaging results that were available during my care of the patient were reviewed by me and considered in my medical decision making (see chart for details).  Pt presents with nasal congestion and cough. Pt is well appearing and vitals are normal. Lungs CTA on exam. Pt CXR negative for acute infiltrate. Flu test is negative. Patients symptoms are consistent with URI, likely viral etiology. Discussed that antibiotics are not indicated for viral infections. Pt will be discharged with symptomatic treatment.  Verbalizes understanding and is agreeable with plan. Pt is hemodynamically stable & in NAD prior to dc.  Vitals:   07/23/18 1248 07/23/18 1444  BP: (!) 139/98 (!) 143/94  Pulse: (!) 102 96  Resp: 18 20  Temp: 99.1 F (37.3 C)   TempSrc: Oral   SpO2: 93% 96%      Final Clinical Impressions(s) / ED Diagnoses   Final diagnoses:  Viral URI with cough    ED Discharge Orders    None       Dartha Lodge, [place] 07/23/18 1449    Loren Racer, MD 07/25/18 [phone]

## 2018-09-19 ENCOUNTER — Emergency Department (HOSPITAL_COMMUNITY): Payer: Medicaid Other

## 2018-09-19 ENCOUNTER — Emergency Department (HOSPITAL_COMMUNITY)
Admission: EM | Admit: 2018-09-19 | Discharge: 2018-09-19 | Disposition: A | Discharge status: Home / Self Care | Payer: Medicaid Other | Attending: Emergency Medicine | Admitting: Emergency Medicine

## 2018-09-19 ENCOUNTER — Other Ambulatory Visit: Payer: Self-pay

## 2018-09-19 ENCOUNTER — Encounter (HOSPITAL_COMMUNITY): Payer: Self-pay

## 2018-09-19 DIAGNOSIS — D571 Sickle-cell disease without crisis: Secondary | ICD-10-CM | POA: Insufficient documentation

## 2018-09-19 DIAGNOSIS — J069 Acute upper respiratory infection, unspecified: Secondary | ICD-10-CM | POA: Diagnosis not present

## 2018-09-19 DIAGNOSIS — R05 Cough: Secondary | ICD-10-CM | POA: Diagnosis present

## 2018-09-19 NOTE — ED Provider Notes (Signed)
[facility]-EMERGENCY DEPT Provider Note   CSN: [id number] Arrival date & time: 09/19/18  [id number]     History   Chief Complaint Chief Complaint  Patient presents with  . Cough    HPI Connie Shea is a 22 y.o. female.  Patient is a 22 year old female with history of sickle cell trait, anemia.  She presents today for evaluation of cough and shortness of breath.  This started 2 days ago and is worsening.  Cough has been intermittently productive.  She denies fevers or chills.  She denies vomiting or diarrhea.  She is here with her daughter who is ill with similar symptoms.  The history is provided by the patient.  Cough  This is a new problem. The current episode started 2 days ago. The problem occurs constantly. The problem has been gradually worsening. The cough is productive of sputum. There has been no fever. Associated symptoms include shortness of breath. Pertinent negatives include no chest pain, no chills, no ear congestion and no sore throat. She has tried nothing for the symptoms.    Past Medical History:  Diagnosis Date  . Acute pyelonephritis 07/19/2016  . Anemia   . Depression   . Genital herpes   . History of acute PID   . Kidney infection   . Obesity   . Pelvic pain 02/13/2017  . Pre-eclampsia   . Sexually transmitted disease   . Sickle cell trait (HCC)   . Stutter     Patient Active Problem List   Diagnosis Date Noted  . Positive urine pregnancy test 08/17/2017  . Decreased appetite 08/17/2017  . Hypertension goal BP (blood pressure) < 130/80 06/23/2017  . Moderate episode of recurrent major depressive disorder (HCC) 06/23/2017  . Neck and shoulder pain 06/23/2017  . Tachycardia 03/21/2017  . Abnormal weight gain during pregnancy, antepartum 03/16/2017  . Vitamin D deficiency 02/13/2017  . Severe obesity (BMI >= 40) (HCC) 02/10/2017  . Episodic tension-type headache, not intractable 02/10/2017  . Elevated LFTs 07/18/2016  . History  of normocytic normochromic anemia 07/18/2016    Past Surgical History:  Procedure Laterality Date  . CESAREAN SECTION    . CESAREAN SECTION       OB History    Gravida  2   Para  1   Term      Preterm  1   AB      Living  1     SAB      TAB      Ectopic      Multiple      Live Births  1            Home Medications    Prior to Admission medications   Medication Sig Start Date End Date Taking? Authorizing Provider  metoCLOPramide (REGLAN) 10 MG tablet Take 1 tablet (10 mg total) by mouth every 6 (six) hours. 09/13/17   Judeth HornLawrence, Erin, NP  omeprazole (PRILOSEC) 20 MG capsule Take 1 capsule (20 mg total) by mouth daily. 09/13/17   Judeth HornLawrence, Erin, NP  pyridOXINE (VITAMIN B-6) 25 MG tablet Take 1 tablet (25 mg total) by mouth every 6 (six) hours as needed (nausea or vomiting). Patient not taking: Reported on 09/13/2017 09/08/17   Rodolph Bongorey, Evan S, MD  traZODone (DESYREL) 100 MG tablet Take 1 tablet (100 mg total) by mouth at bedtime. 03/23/17   Charm RingsLord, Jamison Y, NP    Family History Family History  Problem Relation Age of Onset  . Hypertension  Mother   . Asthma Mother   . Hypertension Maternal Aunt     Social History Social History   Tobacco Use  . Smoking status: Never Smoker  . Smokeless tobacco: Never Used  Substance Use Topics  . Alcohol use: Yes    Comment: 1 day a week  . Drug use: No     Allergies   Phentermine hcl   Review of Systems Review of Systems  Constitutional: Negative for chills.  HENT: Negative for sore throat.   Respiratory: Positive for cough and shortness of breath.   Cardiovascular: Negative for chest pain.  All other systems reviewed and are negative.    Physical Exam Updated Vital Signs BP (!) 147/96 (BP Location: Right Arm)   Pulse (!) 111   Temp 98.5 F (36.9 C) (Oral)   Resp 16   Ht 5\' 4"  (1.626 m)   Wt 86.2 kg   LMP 09/12/2018   SpO2 94%   BMI 32.61 kg/m   Physical Exam  Constitutional: She is oriented  to person, place, and time. She appears well-developed and well-nourished. No distress.  HENT:  Head: Normocephalic and atraumatic.  Mouth/Throat: Oropharynx is clear and moist.  TMs are clear bilaterally  Neck: Normal range of motion. Neck supple.  Cardiovascular: Normal rate and regular rhythm. Exam reveals no gallop and no friction rub.  No murmur heard. Pulmonary/Chest: Effort normal and breath sounds normal. No respiratory distress. She has no wheezes.  Abdominal: Soft. Bowel sounds are normal. She exhibits no distension. There is no tenderness.  Musculoskeletal: Normal range of motion.  Lymphadenopathy:    She has no cervical adenopathy.  Neurological: She is alert and oriented to person, place, and time.  Skin: Skin is warm and dry. She is not diaphoretic.  Nursing note and vitals reviewed.    ED Treatments / Results  Labs (all labs ordered are listed, but only abnormal results are displayed) Labs Reviewed - No data to display  EKG None  Radiology No results found.  Procedures Procedures (including critical care time)  Medications Ordered in ED Medications - No data to display   Initial Impression / Assessment and Plan / ED Course  I have reviewed the triage vital signs and the nursing notes.  Pertinent labs & imaging results that were available during my care of the patient were reviewed by me and considered in my medical decision making (see chart for details).  Patient presenting with cough for the past 2 days.  She is here with her daughter who is ill in a similar fashion.  Chest x-ray is clear and lungs are clear as well.  I highly suspect a viral etiology.  I will recommend over-the-counter medications and return as needed for any problems.  Final Clinical Impressions(s) / ED Diagnoses   Final diagnoses:  None    ED Discharge Orders    None       Geoffery Lyons, MD 09/19/18 1149

## 2018-09-19 NOTE — ED Triage Notes (Signed)
Pt states that she has had a cough, along with some SHOB since yesterday.

## 2018-09-19 NOTE — Discharge Instructions (Addendum)
Drink plenty of fluids and get plenty of rest.  Ibuprofen 600 mg rotated with Tylenol 1000 mg every 4 hours as needed for fever.  Use over-the-counter medications as needed for relief of your symptoms.  Return to the emergency department for severe chest pain, worsening breathing, or other new and concerning symptoms.

## 2019-10-15 ENCOUNTER — Encounter: Payer: Medicaid Other | Admitting: Family Medicine

## 2019-10-15 ENCOUNTER — Encounter: Payer: Self-pay | Admitting: Family Medicine

## 2019-12-02 ENCOUNTER — Emergency Department (HOSPITAL_COMMUNITY)
Admission: EM | Admit: 2019-12-02 | Discharge: 2019-12-03 | Disposition: A | Discharge status: Home / Self Care | Payer: Medicaid Other | Attending: Emergency Medicine | Admitting: Emergency Medicine

## 2019-12-02 ENCOUNTER — Emergency Department (HOSPITAL_COMMUNITY): Payer: Medicaid Other

## 2019-12-02 ENCOUNTER — Encounter (HOSPITAL_COMMUNITY): Payer: Self-pay

## 2019-12-02 ENCOUNTER — Other Ambulatory Visit: Payer: Self-pay

## 2019-12-02 DIAGNOSIS — Z3A01 Less than 8 weeks gestation of pregnancy: Secondary | ICD-10-CM | POA: Diagnosis not present

## 2019-12-02 DIAGNOSIS — Z3491 Encounter for supervision of normal pregnancy, unspecified, first trimester: Secondary | ICD-10-CM

## 2019-12-02 DIAGNOSIS — Z3490 Encounter for supervision of normal pregnancy, unspecified, unspecified trimester: Secondary | ICD-10-CM

## 2019-12-02 DIAGNOSIS — O219 Vomiting of pregnancy, unspecified: Secondary | ICD-10-CM | POA: Insufficient documentation

## 2019-12-02 LAB — URINALYSIS, ROUTINE W REFLEX MICROSCOPIC
Bilirubin Urine: NEGATIVE
Glucose, UA: NEGATIVE mg/dL
Ketones, ur: 80 mg/dL — AB
Nitrite: NEGATIVE
Protein, ur: NEGATIVE mg/dL
Specific Gravity, Urine: 1.018 (ref 1.005–1.030)
pH: 6 (ref 5.0–8.0)

## 2019-12-02 LAB — COMPREHENSIVE METABOLIC PANEL
ALT: 18 U/L (ref 0–44)
AST: 14 U/L — ABNORMAL LOW (ref 15–41)
Albumin: 4.2 g/dL (ref 3.5–5.0)
Alkaline Phosphatase: 48 U/L (ref 38–126)
Anion gap: 12 (ref 5–15)
BUN: 10 mg/dL (ref 6–20)
CO2: 20 mmol/L — ABNORMAL LOW (ref 22–32)
Calcium: 9.5 mg/dL (ref 8.9–10.3)
Chloride: 104 mmol/L (ref 98–111)
Creatinine, Ser: 0.46 mg/dL (ref 0.44–1.00)
GFR calc Af Amer: >60 mL/min (ref >60)
GFR calc non Af Amer: >60 mL/min (ref >60)
Glucose, Bld: 98 mg/dL (ref 70–99)
Potassium: 3.3 mmol/L — ABNORMAL LOW (ref 3.5–5.1)
Sodium: 136 mmol/L (ref 135–145)
Total Bilirubin: 0.8 mg/dL (ref 0.3–1.2)
Total Protein: 7.8 g/dL (ref 6.5–8.1)

## 2019-12-02 LAB — LIPASE, BLOOD: Lipase: 21 U/L (ref 11–51)

## 2019-12-02 LAB — I-STAT BETA HCG BLOOD, ED (MC, WL, AP ONLY): I-stat hCG, quantitative: >2000 m[IU]/mL — ABNORMAL HIGH (ref <5)

## 2019-12-02 MED ORDER — SODIUM CHLORIDE 0.9% FLUSH
3.0000 mL | Freq: Once | INTRAVENOUS | Status: AC
  Administered 2019-12-02: 3 mL via INTRAVENOUS

## 2019-12-02 MED ORDER — SODIUM CHLORIDE 0.9 % IV BOLUS
1000.0000 mL | Freq: Once | INTRAVENOUS | Status: AC
  Administered 2019-12-02: 1000 mL via INTRAVENOUS

## 2019-12-02 MED ORDER — ONDANSETRON HCL 4 MG/2ML IJ SOLN
4.0000 mg | Freq: Once | INTRAMUSCULAR | Status: AC
  Administered 2019-12-02: 4 mg via INTRAVENOUS
  Filled 2019-12-02: qty 2

## 2019-12-02 NOTE — ED Triage Notes (Signed)
Patient arrived stating that she has had upper abdominal pain and NVD for two days. Reporting last period Jan 12.

## 2019-12-03 LAB — CBC
HCT: 37 % (ref 36.0–46.0)
Hemoglobin: 12.4 g/dL (ref 12.0–15.0)
MCH: 29.2 pg (ref 26.0–34.0)
MCHC: 33.5 g/dL (ref 30.0–36.0)
MCV: 87.1 fL (ref 80.0–100.0)
Platelets: 376 10*3/uL (ref 150–400)
RBC: 4.25 MIL/uL (ref 3.87–5.11)
RDW: 13.6 % (ref 11.5–15.5)
WBC: 12.5 10*3/uL — ABNORMAL HIGH (ref 4.0–10.5)
nRBC: 0 % (ref 0.0–0.2)

## 2019-12-03 NOTE — Discharge Instructions (Signed)
Please follow-up with an OB/GYN doctor.  If you do not have one, please contact the one provided.  If you develop abdominal pain, vaginal bleeding, worsening vomiting or other new concerning symptom, recommend return to ER for reassessment.

## 2019-12-03 NOTE — ED Notes (Signed)
Pt reports improvement with nausea and pain. Pt given ginger ale for fluid challenge at this time.

## 2019-12-03 NOTE — ED Provider Notes (Signed)
[facility] DEPT Provider Note   CSN: [id number] Arrival date & time: 12/02/19  2119     History Chief Complaint  Patient presents with  . Abdominal Pain  . Emesis    Connie Shea is a 24 y.o. female.  Presents to ER with chief complaint nausea and vomiting.  Symptoms have been ongoing for the last few days, worse over the past 2 days.  Has had multiple episodes of vomiting today, nonbloody nonbilious.  No vomiting this evening but continues to be nauseous.  Has had some upper abdominal discomfort, not currently having any pain.  No pain in her lower abdomen, no cramping.  No vaginal bleeding, vaginal discharge.  Last menstrual period January 12.  Had 1 child previously, complicated by preeclampsia.  Had C-section.  HPI     Past Medical History:  Diagnosis Date  . Acute pyelonephritis 07/19/2016  . Anemia   . Depression   . Genital herpes   . History of acute PID   . Kidney infection   . Obesity   . Pelvic pain 02/13/2017  . Pre-eclampsia   . Sexually transmitted disease   . Sickle cell trait ([place])   . Stutter     Patient Active Problem List   Diagnosis Date Noted  . Positive urine pregnancy test 08/17/2017  . Decreased appetite 08/17/2017  . Hypertension goal BP (blood pressure) < 130/80 06/23/2017  . Moderate episode of recurrent major depressive disorder ([place]) 06/23/2017  . Neck and shoulder pain 06/23/2017  . Tachycardia 03/21/2017  . Abnormal weight gain during pregnancy, antepartum 03/16/2017  . Vitamin D deficiency 02/13/2017  . Severe obesity (BMI >= 40) ([place]) 02/10/2017  . Episodic tension-type headache, not intractable 02/10/2017  . Elevated LFTs 07/18/2016  . History of normocytic normochromic anemia 07/18/2016    Past Surgical History:  Procedure Laterality Date  . CESAREAN SECTION    . CESAREAN SECTION       OB History    Gravida  2   Para  1   Term      Preterm  1   AB      Living  1     SAB      TAB      Ectopic      Multiple      Live Births  1           Family History  Problem Relation Age of Onset  . Hypertension Mother   . Asthma Mother   . Hypertension Maternal Aunt     Social History   Tobacco Use  . Smoking status: Never Smoker  . Smokeless tobacco: Never Used  Substance Use Topics  . Alcohol use: Yes    Comment: 1 day a week  . Drug use: No    Home Medications Prior to Admission medications   Not on File    Allergies    Phentermine hcl  Review of Systems   Review of Systems  Constitutional: Negative for chills and fever.  HENT: Negative for ear pain and sore throat.   Eyes: Negative for pain and visual disturbance.  Respiratory: Negative for cough and shortness of breath.   Cardiovascular: Negative for chest pain and palpitations.  Gastrointestinal: Positive for abdominal pain, nausea and vomiting.  Genitourinary: Negative for dysuria and hematuria.  Musculoskeletal: Negative for arthralgias and back pain.  Skin: Negative for color change and rash.  Neurological: Negative for seizures and syncope.  All other systems reviewed and are  negative.   Physical Exam Updated Vital Signs BP (!) 147/97   Pulse 80   Temp 98.1 F (36.7 C) (Oral)   Resp 17   Ht 5\' 4"  (1.626 m)   Wt 86.2 kg   LMP 10/22/2019   SpO2 95%   BMI 32.61 kg/m   Physical Exam Vitals and nursing note reviewed.  Constitutional:      General: She is not in acute distress.    Appearance: She is well-developed.  HENT:     Head: Normocephalic and atraumatic.  Eyes:     Conjunctiva/sclera: Conjunctivae normal.  Cardiovascular:     Rate and Rhythm: Normal rate and regular rhythm.     Heart sounds: No murmur.  Pulmonary:     Effort: Pulmonary effort is normal. No respiratory distress.     Breath sounds: Normal breath sounds.  Abdominal:     General: Abdomen is flat. Bowel sounds are normal.     Palpations: Abdomen is soft.     Tenderness: There is no abdominal  tenderness.     Hernia: No hernia is present.  Musculoskeletal:     Cervical back: Neck supple.  Skin:    General: Skin is warm and dry.     Capillary Refill: Capillary refill takes less than 2 seconds.  Neurological:     Mental Status: She is alert.     ED Results / Procedures / Treatments   Labs (all labs ordered are listed, but only abnormal results are displayed) Labs Reviewed  COMPREHENSIVE METABOLIC PANEL - Abnormal; Notable for the following components:      Result Value   Potassium 3.3 (*)    CO2 20 (*)    AST 14 (*)    All other components within normal limits  URINALYSIS, ROUTINE W REFLEX MICROSCOPIC - Abnormal; Notable for the following components:   Hgb urine dipstick SMALL (*)    Ketones, ur 80 (*)    Leukocytes,Ua SMALL (*)    Bacteria, UA RARE (*)    All other components within normal limits  CBC - Abnormal; Notable for the following components:   WBC 12.5 (*)    All other components within normal limits  I-STAT BETA HCG BLOOD, ED ([facility], [facility], [facility] ONLY) - Abnormal; Notable for the following components:   I-stat hCG, quantitative >2,000.0 (*)    All other components within normal limits  LIPASE, BLOOD    EKG None  Radiology 12/20/2019 OB Comp < 14 Wks  Result Date: 12/03/2019 CLINICAL DATA:  Abdominal pain, positive pregnancy test EXAM: OBSTETRIC <14 WK 12/05/2019 AND TRANSVAGINAL OB US TECHNIQUE: Both transabdominal and transvaginal ultrasound examinations were performed for complete evaluation of the gestation as well as the maternal uterus, adnexal regions, and pelvic cul-de-sac. Transvaginal technique was performed to assess early pregnancy. COMPARISON:  None. FINDINGS: Intrauterine gestational sac: Single Yolk sac:  Visualized. Embryo:  Visualized. Cardiac Activity: Visualized. Heart Rate: 118 bpm CRL:  6 mm   6 w   3 d                  [place] EDC: 07/24/2020 Subchorionic hemorrhage:  None visualized. Maternal uterus/adnexae: Right ovary measures 2.7 x 1.6 x 1.6 cm and the left  ovary measures 2.3 x 2.1 by 2.2 cm. Likely corpus luteum cyst within the left ovary measuring 2.1 cm. There is no free fluid. IMPRESSION: 1. Single live intrauterine pregnancy as above, estimated age 31 weeks and 3 days. 2. Likely corpus luteum cyst left ovary. Electronically  Signed   By: Sharlet Salina M.D.   On: 12/03/2019 00:14   US OB Transvaginal  Result Date: 12/03/2019 CLINICAL DATA:  Abdominal pain, positive pregnancy test EXAM: OBSTETRIC <14 WK [place] AND TRANSVAGINAL OB US TECHNIQUE: Both transabdominal and transvaginal ultrasound examinations were performed for complete evaluation of the gestation as well as the maternal uterus, adnexal regions, and pelvic cul-de-sac. Transvaginal technique was performed to assess early pregnancy. COMPARISON:  None. FINDINGS: Intrauterine gestational sac: Single Yolk sac:  Visualized. Embryo:  Visualized. Cardiac Activity: Visualized. Heart Rate: 118 bpm CRL:  6 mm   6 w   3 d                  [place] EDC: 07/24/2020 Subchorionic hemorrhage:  None visualized. Maternal uterus/adnexae: Right ovary measures 2.7 x 1.6 x 1.6 cm and the left ovary measures 2.3 x 2.1 by 2.2 cm. Likely corpus luteum cyst within the left ovary measuring 2.1 cm. There is no free fluid. IMPRESSION: 1. Single live intrauterine pregnancy as above, estimated age 28 weeks and 3 days. 2. Likely corpus luteum cyst left ovary. Electronically Signed   By: Sharlet Salina M.D.   On: 12/03/2019 00:14    Procedures Ultrasound ED OB Pelvic  Date/Time: 12/03/2019 12:05 AM Performed by: Milagros Loll, MD Authorized by: Milagros Loll, MD   Procedure details:    Indications: evaluate for IUP     Assess:  Intrauterine pregnancy   Technique:  Transabdominal obstetric (HCG+) exam   Images: archived   Study Limitations: body habitus Uterine findings:    Intrauterine pregnancy: identified     Single gestation: identified     Gestational sac: identified     Fetal heart rate: not identified        Left ovary findings:    Left ovary:  Unable to visualize   Adnexal mass: not identified   Cysts: not identified   Right ovary findings:     Right ovary:  Unable to visualize   Adnexal mass: not identified   Cysts: not identified   Other findings:    Free pelvic fluid: not identified     Free peritoneal fluid: not identified     (including critical care time)  Medications Ordered in ED Medications  sodium chloride flush (NS) 0.9 % injection 3 mL (3 mLs Intravenous Given 12/02/19 2332)  sodium chloride 0.9 % bolus 1,000 mL (1,000 mLs Intravenous New Bag/Given 12/02/19 2331)  ondansetron (ZOFRAN) injection 4 mg (4 mg Intravenous Given 12/02/19 2332)    ED Course  I have reviewed the triage vital signs and the nursing notes.  Pertinent labs & imaging results that were available during my care of the patient were reviewed by me and considered in my medical decision making (see chart for details).    MDM Rules/Calculators/A&P                      24 year old lady presented to ER with nausea, vomiting and some abdominal discomfort.  On exam patient noted be well-appearing.  Labs were notable for positive pregnancy.  Brief bedside ultrasound confirmed single IUP.  Formal ultrasound ordered to confirm, evaluate for fetal heart tones.  Suspect her symptoms are related to pregnancy.  No focal abdominal pain, no report of any pain in lower abdomen or pelvic area.  Symptoms resolved after single dose Zofran and fluids.  Will discharge home.    After the discussed management above, the patient was determined to  be safe for discharge.  The patient was in agreement with this plan and all questions regarding their care were answered.  ED return precautions were discussed and the patient will return to the ED with any significant worsening of condition.   Final Clinical Impression(s) / ED Diagnoses Final diagnoses:  Intrauterine pregnancy  First trimester pregnancy    Rx / DC Orders ED  Discharge Orders    None       Milagros Loll, MD 12/03/19 [phone]

## 2019-12-30 ENCOUNTER — Encounter: Payer: Self-pay | Admitting: *Deleted

## 2020-01-01 ENCOUNTER — Encounter: Payer: Self-pay | Admitting: Obstetrics and Gynecology

## 2020-01-01 DIAGNOSIS — O0990 Supervision of high risk pregnancy, unspecified, unspecified trimester: Secondary | ICD-10-CM | POA: Insufficient documentation

## 2020-01-01 DIAGNOSIS — O10919 Unspecified pre-existing hypertension complicating pregnancy, unspecified trimester: Secondary | ICD-10-CM | POA: Insufficient documentation

## 2020-01-02 DIAGNOSIS — O0990 Supervision of high risk pregnancy, unspecified, unspecified trimester: Secondary | ICD-10-CM

## 2020-01-02 DIAGNOSIS — B009 Herpesviral infection, unspecified: Secondary | ICD-10-CM | POA: Insufficient documentation

## 2020-01-22 ENCOUNTER — Ambulatory Visit (INDEPENDENT_AMBULATORY_CARE_PROVIDER_SITE_OTHER): Payer: Medicaid Other | Admitting: *Deleted

## 2020-01-22 DIAGNOSIS — O0990 Supervision of high risk pregnancy, unspecified, unspecified trimester: Secondary | ICD-10-CM

## 2020-01-22 NOTE — Progress Notes (Signed)
I connected with  Connie Shea on 01/22/20 at  9:30 AM EDT by telephone and verified that I am speaking with the correct person using two identifiers.   She asked me if we could reschedule her appointment. I checked with registrar and there were no appointments for new ob intake until after 5/14 and I asked Zillah if there is any way she can do visit now since she is already [redacted] weeks pregnant.  She then informed me she did not think she needed to be seen because she has had a miscarriage. I asked her where she had the miscarriage evaluated. She states she hasn't been seen. I asked her why she thought she had a miscarriage. She states she had a period that was more of spotting with some clots; not regular period off and on for about 1- 2 weeks. She states she hasn't bled in about a week. She denies any pain .  I informed her she needs to be evaluated to see if she has had a miscarriage or not because if she has; but all products have not emptied that it can be serious. I advised her to go to the hospital for evaluation asap. She voices understanding. Cyana Shook,RN

## 2020-01-23 ENCOUNTER — Encounter: Payer: Medicaid Other | Admitting: Obstetrics and Gynecology

## 2020-10-15 ENCOUNTER — Encounter (HOSPITAL_COMMUNITY): Payer: Self-pay | Admitting: Obstetrics and Gynecology

## 2020-10-15 ENCOUNTER — Inpatient Hospital Stay (HOSPITAL_COMMUNITY): Payer: Medicaid Other

## 2020-10-15 ENCOUNTER — Inpatient Hospital Stay (HOSPITAL_COMMUNITY)
Admission: EM | Admit: 2020-10-15 | Discharge: 2020-10-15 | Disposition: A | Discharge status: Home / Self Care | Payer: Medicaid Other | Attending: Obstetrics and Gynecology | Admitting: Obstetrics and Gynecology

## 2020-10-15 DIAGNOSIS — O99281 Endocrine, nutritional and metabolic diseases complicating pregnancy, first trimester: Secondary | ICD-10-CM

## 2020-10-15 DIAGNOSIS — IMO0001 Reserved for inherently not codable concepts without codable children: Secondary | ICD-10-CM

## 2020-10-15 DIAGNOSIS — E876 Hypokalemia: Secondary | ICD-10-CM

## 2020-10-15 DIAGNOSIS — O208 Other hemorrhage in early pregnancy: Secondary | ICD-10-CM | POA: Insufficient documentation

## 2020-10-15 DIAGNOSIS — O210 Mild hyperemesis gravidarum: Secondary | ICD-10-CM | POA: Diagnosis present

## 2020-10-15 DIAGNOSIS — O3680X0 Pregnancy with inconclusive fetal viability, not applicable or unspecified: Secondary | ICD-10-CM | POA: Diagnosis not present

## 2020-10-15 DIAGNOSIS — Z3A09 9 weeks gestation of pregnancy: Secondary | ICD-10-CM | POA: Diagnosis not present

## 2020-10-15 DIAGNOSIS — O26891 Other specified pregnancy related conditions, first trimester: Secondary | ICD-10-CM | POA: Diagnosis not present

## 2020-10-15 DIAGNOSIS — R6883 Chills (without fever): Secondary | ICD-10-CM | POA: Insufficient documentation

## 2020-10-15 DIAGNOSIS — O219 Vomiting of pregnancy, unspecified: Secondary | ICD-10-CM

## 2020-10-15 DIAGNOSIS — U071 COVID-19: Secondary | ICD-10-CM | POA: Diagnosis not present

## 2020-10-15 DIAGNOSIS — O98511 Other viral diseases complicating pregnancy, first trimester: Secondary | ICD-10-CM

## 2020-10-15 DIAGNOSIS — O458X1 Other premature separation of placenta, first trimester: Secondary | ICD-10-CM

## 2020-10-15 DIAGNOSIS — R110 Nausea: Secondary | ICD-10-CM

## 2020-10-15 DIAGNOSIS — Z3490 Encounter for supervision of normal pregnancy, unspecified, unspecified trimester: Secondary | ICD-10-CM

## 2020-10-15 DIAGNOSIS — O418X10 Other specified disorders of amniotic fluid and membranes, first trimester, not applicable or unspecified: Secondary | ICD-10-CM

## 2020-10-15 LAB — COMPREHENSIVE METABOLIC PANEL
ALT: 21 U/L (ref 0–44)
AST: 18 U/L (ref 15–41)
Albumin: 3.9 g/dL (ref 3.5–5.0)
Alkaline Phosphatase: 43 U/L (ref 38–126)
Anion gap: 11 (ref 5–15)
BUN: 7 mg/dL (ref 6–20)
CO2: 24 mmol/L (ref 22–32)
Calcium: 9.3 mg/dL (ref 8.9–10.3)
Chloride: 102 mmol/L (ref 98–111)
Creatinine, Ser: 0.51 mg/dL (ref 0.44–1.00)
GFR, Estimated: >60 mL/min (ref >60)
Glucose, Bld: 78 mg/dL (ref 70–99)
Potassium: 3.2 mmol/L — ABNORMAL LOW (ref 3.5–5.1)
Sodium: 137 mmol/L (ref 135–145)
Total Bilirubin: 0.6 mg/dL (ref 0.3–1.2)
Total Protein: 6.9 g/dL (ref 6.5–8.1)

## 2020-10-15 LAB — CBC WITH DIFFERENTIAL/PLATELET
Abs Immature Granulocytes: 0.02 10*3/uL (ref 0.00–0.07)
Basophils Absolute: 0.1 10*3/uL (ref 0.0–0.1)
Basophils Relative: 1 %
Eosinophils Absolute: 0 10*3/uL (ref 0.0–0.5)
Eosinophils Relative: 0 %
HCT: 33.8 % — ABNORMAL LOW (ref 36.0–46.0)
Hemoglobin: 11.8 g/dL — ABNORMAL LOW (ref 12.0–15.0)
Immature Granulocytes: 0 %
Lymphocytes Relative: 11 %
Lymphs Abs: 0.7 10*3/uL (ref 0.7–4.0)
MCH: 30.3 pg (ref 26.0–34.0)
MCHC: 34.9 g/dL (ref 30.0–36.0)
MCV: 86.7 fL (ref 80.0–100.0)
Monocytes Absolute: 1.1 10*3/uL — ABNORMAL HIGH (ref 0.1–1.0)
Monocytes Relative: 18 %
Neutro Abs: 4.2 10*3/uL (ref 1.7–7.7)
Neutrophils Relative %: 70 %
Platelets: 251 10*3/uL (ref 150–400)
RBC: 3.9 MIL/uL (ref 3.87–5.11)
RDW: 12.7 % (ref 11.5–15.5)
WBC: 6.1 10*3/uL (ref 4.0–10.5)
nRBC: 0 % (ref 0.0–0.2)

## 2020-10-15 LAB — URINALYSIS, ROUTINE W REFLEX MICROSCOPIC
Bilirubin Urine: NEGATIVE
Glucose, UA: NEGATIVE mg/dL
Hgb urine dipstick: NEGATIVE
Ketones, ur: 80 mg/dL — AB
Leukocytes,Ua: NEGATIVE
Nitrite: NEGATIVE
Protein, ur: NEGATIVE mg/dL
Specific Gravity, Urine: 1.02 (ref 1.005–1.030)
pH: 5 (ref 5.0–8.0)

## 2020-10-15 LAB — RESP PANEL BY RT-PCR (FLU A&B, COVID) ARPGX2
Influenza A by PCR: NEGATIVE
Influenza B by PCR: NEGATIVE
SARS Coronavirus 2 by RT PCR: POSITIVE — AB

## 2020-10-15 LAB — POC URINE PREG, ED: Preg Test, Ur: POSITIVE — AB

## 2020-10-15 LAB — ABO/RH: ABO/RH(D): O POS

## 2020-10-15 MED ORDER — METOCLOPRAMIDE HCL 10 MG PO TABS: 10.0000 mg | ORAL_TABLET | Freq: Three times a day (TID) | ORAL | 1 refills | Status: DC

## 2020-10-15 MED ORDER — PANTOPRAZOLE SODIUM 40 MG IV SOLR
40.0000 mg | Freq: Once | INTRAVENOUS | Status: AC
  Administered 2020-10-15: 40 mg via INTRAVENOUS
  Filled 2020-10-15: qty 40

## 2020-10-15 MED ORDER — METOCLOPRAMIDE HCL 5 MG/ML IJ SOLN
10.0000 mg | Freq: Once | INTRAMUSCULAR | Status: AC
  Administered 2020-10-15: 10 mg via INTRAVENOUS
  Filled 2020-10-15: qty 2

## 2020-10-15 MED ORDER — LACTATED RINGERS IV BOLUS
1000.0000 mL | Freq: Once | INTRAVENOUS | Status: AC
  Administered 2020-10-15: 1000 mL via INTRAVENOUS

## 2020-10-15 MED ORDER — FAMOTIDINE 20 MG PO TABS: 20.0000 mg | ORAL_TABLET | Freq: Every day | ORAL | 1 refills | Status: DC

## 2020-10-15 NOTE — Discharge Instructions (Signed)
Safe Medications in Pregnancy    Acne: Benzoyl Peroxide Salicylic Acid  Backache/Headache: Tylenol: 2 regular strength every 4 hours OR              2 Extra strength every 6 hours  Colds/Coughs/Allergies: Benadryl (alcohol free) 25 mg every 6 hours as needed Breath right strips Claritin Cepacol throat lozenges Chloraseptic throat spray Cold-Eeze- up to three times per day Cough drops, alcohol free Flonase (by prescription only) Guaifenesin Mucinex Robitussin DM (plain only, alcohol free) Saline nasal spray/drops Sudafed (pseudoephedrine) & Actifed ** use only after [redacted] weeks gestation and if you do not have high blood pressure Tylenol Vicks Vaporub Zinc lozenges Zyrtec   Constipation: Colace Ducolax suppositories Fleet enema Glycerin suppositories Metamucil Milk of magnesia Miralax Senokot Smooth move tea  Diarrhea: Kaopectate Imodium A-D  *NO pepto Bismol  Hemorrhoids: Anusol Anusol HC Preparation H Tucks  Indigestion: Tums Maalox Mylanta Zantac  Pepcid  Insomnia: Benadryl (alcohol free) 25mg  every 6 hours as needed Tylenol PM Unisom, no Gelcaps  Leg Cramps: Tums MagGel  Nausea/Vomiting:  Bonine Dramamine Emetrol Ginger extract Sea bands Meclizine  Nausea medication to take during pregnancy:  Unisom (doxylamine succinate 25 mg tablets) Take one tablet daily at bedtime. If symptoms are not adequately controlled, the dose can be increased to a maximum recommended dose of two tablets daily (1/2 tablet in the morning, 1/2 tablet mid-afternoon and one at bedtime). Vitamin B6 100mg  tablets. Take one tablet twice a day (up to 200 mg per day).  Skin Rashes: Aveeno products Benadryl cream or 25mg  every 6 hours as needed Calamine Lotion 1% cortisone cream  Yeast infection: Gyne-lotrimin 7 Monistat 7   **If taking multiple medications, please check labels to avoid duplicating the same active ingredients **take  medication as directed on the label ** Do not exceed 4000 mg of tylenol in 24 hours **Do not take medications that contain aspirin or ibuprofen          Prenatal Care Providers           [facility] @ [facility] - accepts patients without insurance  Phone: [phone]  [facility] [employer] @ [facility]   Phone: [phone]  [facility] @[place]       Phone: [phone]            [facility] @ [place]     Phone: [phone]          [facility] @ [employer]   Phone: [phone]  [facility] @ [facility] - accepts patients without insurance  Phone: [phone]  [facility] @ [facility] Phone: [phone]     [facility] - accepts patients without insurance Phone: [phone]  [facility]  Phone: [phone]  [facility] Phone: [phone]  [facility] Phone: [phone]  [facility] Phone: [phone]  [facility] Phone: [phone]  [facility]  Phone: [phone]          COVID-19 COVID-19 is a respiratory infection that is caused by a virus called severe acute respiratory syndrome coronavirus 2 (SARS-CoV-2). The disease is also known as coronavirus disease or novel coronavirus. In some people, the virus may not cause any symptoms. In others, it may cause a serious infection. The infection can get worse quickly and can lead to complications, such as:  Pneumonia, or infection of the lungs.  Acute respiratory distress syndrome or ARDS.  This is a condition in which fluid build-up in the lungs prevents the lungs from filling with air and passing oxygen into the blood.  Acute respiratory failure. This is a condition in which there is not enough oxygen passing from the lungs to the body or when carbon dioxide is not passing from  the lungs out of the body.  Sepsis or septic shock. This is a serious bodily reaction to an infection.  Blood clotting problems.  Secondary infections due to bacteria or fungus.  Organ failure. This is when your body's organs stop working. The virus that causes COVID-19 is contagious. This means that it can spread from person to person through droplets from coughs and sneezes (respiratory secretions). What are the causes? This illness is caused by a virus. You may catch the virus by:  Breathing in droplets from an infected person. Droplets can be spread by a person breathing, speaking, singing, coughing, or sneezing.  Touching something, like a table or a doorknob, that was exposed to the virus (contaminated) and then touching your mouth, nose, or eyes. What increases the risk? Risk for infection You are more likely to be infected with this virus if you:  Are within 6 feet (2 meters) of a person with COVID-19.  Provide care for or live with a person who is infected with COVID-19.  Spend time in crowded indoor spaces or live in shared housing. Risk for serious illness You are more likely to become seriously ill from the virus if you:  Are 14 years of age or older. The higher your age, the more you are at risk for serious illness.  Live in a nursing home or long-term care facility.  Have cancer.  Have a long-term (chronic) disease such as: ? Chronic lung disease, including chronic obstructive pulmonary disease or asthma. ? A long-term disease that lowers your body's ability to fight infection (immunocompromised). ? Heart disease, including heart failure, a condition in which the arteries that lead to the heart become narrow or blocked (coronary artery disease), a disease which makes the heart muscle thick, weak, or stiff (cardiomyopathy). ? Diabetes. ? Chronic kidney disease. ? Sickle cell disease, a condition in which red blood cells have an abnormal "sickle" shape. ? Liver  disease.  Are obese. What are the signs or symptoms? Symptoms of this condition can range from mild to severe. Symptoms may appear any time from 2 to 14 days after being exposed to the virus. They include:  A fever or chills.  A cough.  Difficulty breathing.  Headaches, body aches, or muscle aches.  Runny or stuffy (congested) nose.  A sore throat.  New loss of taste or smell. Some people may also have stomach problems, such as nausea, vomiting, or diarrhea. Other people may not have any symptoms of COVID-19. How is this diagnosed? This condition may be diagnosed based on:  Your signs and symptoms, especially if: ? You live in an area with a COVID-19 outbreak. ? You recently traveled to or from an area where the virus is common. ? You provide care for or live with a person who was diagnosed with COVID-19. ? You were exposed to a person who was diagnosed with COVID-19.  A physical exam.  Lab tests, which may include: ? Taking a sample of fluid from the back of your nose and throat (nasopharyngeal fluid), your nose, or your throat using a swab. ? A sample of mucus from your lungs (sputum). ? Blood tests.  Imaging tests, which  may include, X-rays, CT scan, or ultrasound. How is this treated? At present, there is no medicine to treat COVID-19. Medicines that treat other diseases are being used on a trial basis to see if they are effective against COVID-19. Your health care provider will talk with you about ways to treat your symptoms. For most people, the infection is mild and can be managed at home with rest, fluids, and over-the-counter medicines. Treatment for a serious infection usually takes places in a hospital intensive care unit (ICU). It may include one or more of the following treatments. These treatments are given until your symptoms improve.  Receiving fluids and medicines through an IV.  Supplemental oxygen. Extra oxygen is given through a tube in the nose, a  face mask, or a hood.  Positioning you to lie on your stomach (prone position). This makes it easier for oxygen to get into the lungs.  Continuous positive airway pressure (CPAP) or bi-level positive airway pressure (BPAP) machine. This treatment uses mild air pressure to keep the airways open. A tube that is connected to a motor delivers oxygen to the body.  Ventilator. This treatment moves air into and out of the lungs by using a tube that is placed in your windpipe.  Tracheostomy. This is a procedure to create a hole in the neck so that a breathing tube can be inserted.  Extracorporeal membrane oxygenation (ECMO). This procedure gives the lungs a chance to recover by taking over the functions of the heart and lungs. It supplies oxygen to the body and removes carbon dioxide. Follow these instructions at home: Lifestyle  If you are sick, stay home except to get medical care. Your health care provider will tell you how long to stay home. Call your health care provider before you go for medical care.  Rest at home as told by your health care provider.  Do not use any products that contain nicotine or tobacco, such as cigarettes, e-cigarettes, and chewing tobacco. If you need help quitting, ask your health care provider.  Return to your normal activities as told by your health care provider. Ask your health care provider what activities are safe for you. General instructions  Take over-the-counter and prescription medicines only as told by your health care provider.  Drink enough fluid to keep your urine pale yellow.  Keep all follow-up visits as told by your health care provider. This is important. How is this prevented?  There is no vaccine to help prevent COVID-19 infection. However, there are steps you can take to protect yourself and others from this virus. To protect yourself:   Do not travel to areas where COVID-19 is a risk. The areas where COVID-19 is reported change often. To  identify high-risk areas and travel restrictions, check the CDC travel website: StageSync.si  If you live in, or must travel to, an area where COVID-19 is a risk, take precautions to avoid infection. ? Stay away from people who are sick. ? Wash your hands often with soap and water for 20 seconds. If soap and water are not available, use an alcohol-based hand sanitizer. ? Avoid touching your mouth, face, eyes, or nose. ? Avoid going out in public, follow guidance from your state and local health authorities. ? If you must go out in public, wear a cloth face covering or face mask. Make sure your mask covers your nose and mouth. ? Avoid crowded indoor spaces. Stay at least 6 feet (2 meters) away from others. ? Disinfect objects  and surfaces that are frequently touched every day. This may include:  Counters and tables.  Doorknobs and light switches.  Sinks and faucets.  Electronics, such as phones, remote controls, keyboards, computers, and tablets. To protect others: If you have symptoms of COVID-19, take steps to prevent the virus from spreading to others.  If you think you have a COVID-19 infection, contact your health care provider right away. Tell your health care team that you think you may have a COVID-19 infection.  Stay home. Leave your house only to seek medical care. Do not use public transport.  Do not travel while you are sick.  Wash your hands often with soap and water for 20 seconds. If soap and water are not available, use alcohol-based hand sanitizer.  Stay away from other members of your household. Let healthy household members care for children and pets, if possible. If you have to care for children or pets, wash your hands often and wear a mask. If possible, stay in your own room, separate from others. Use a different bathroom.  Make sure that all people in your household wash their hands well and often.  Cough or sneeze into a tissue or your sleeve or  elbow. Do not cough or sneeze into your hand or into the air.  Wear a cloth face covering or face mask. Make sure your mask covers your nose and mouth. Where to find more information  Centers for Disease Control and Prevention: StickerEmporium.tn  World Health Organization: https://thompson-craig.com/ Contact a health care provider if:  You live in or have traveled to an area where COVID-19 is a risk and you have symptoms of the infection.  You have had contact with someone who has COVID-19 and you have symptoms of the infection. Get help right away if:  You have trouble breathing.  You have pain or pressure in your chest.  You have confusion.  You have bluish lips and fingernails.  You have difficulty waking from sleep.  You have symptoms that get worse. These symptoms may represent a serious problem that is an emergency. Do not wait to see if the symptoms will go away. Get medical help right away. Call your local emergency services (911 in the [place] Do not drive yourself to the hospital. Let the emergency medical personnel know if you think you have COVID-19. Summary  COVID-19 is a respiratory infection that is caused by a virus. It is also known as coronavirus disease or novel coronavirus. It can cause serious infections, such as pneumonia, acute respiratory distress syndrome, acute respiratory failure, or sepsis.  The virus that causes COVID-19 is contagious. This means that it can spread from person to person through droplets from breathing, speaking, singing, coughing, or sneezing.  You are more likely to develop a serious illness if you are 65 years of age or older, have a weak immune system, live in a nursing home, or have chronic disease.  There is no medicine to treat COVID-19. Your health care provider will talk with you about ways to treat your symptoms.  Take steps to protect yourself and others from infection. Wash your hands  often and disinfect objects and surfaces that are frequently touched every day. Stay away from people who are sick and wear a mask if you are sick. This information is not intended to replace advice given to you by your health care provider. Make sure you discuss any questions you have with your health care provider. Document Revised: 07/26/2019 Document Reviewed: 11/01/2018  Elsevier Patient Education  [employer].        10 Things You Can Do to Manage Your COVID-19 Symptoms at Home If you have possible or confirmed COVID-19: 1. Stay home from work and school. And stay away from other public places. If you must go out, avoid using any kind of public transportation, ridesharing, or taxis. 2. Monitor your symptoms carefully. If your symptoms get worse, call your healthcare provider immediately. 3. Get rest and stay hydrated. 4. If you have a medical appointment, call the healthcare provider ahead of time and tell them that you have or may have COVID-19. 5. For medical emergencies, call 911 and notify the dispatch personnel that you have or may have COVID-19. 6. Cover your cough and sneezes with a tissue or use the inside of your elbow. 7. Wash your hands often with soap and water for at least 20 seconds or clean your hands with an alcohol-based hand sanitizer that contains at least 60% alcohol. 8. As much as possible, stay in a specific room and away from other people in your home. Also, you should use a separate bathroom, if available. If you need to be around other people in or outside of the home, wear a mask. 9. Avoid sharing personal items with other people in your household, like dishes, towels, and bedding. 10. Clean all surfaces that are touched often, like counters, tabletops, and doorknobs. Use household cleaning sprays or wipes according to the label instructions. SouthAmericaFlowers.co.uk 04/10/2019 This information is not intended to replace advice given to you by your health  care provider. Make sure you discuss any questions you have with your health care provider. Document Revised: 09/12/2019 Document Reviewed: 09/12/2019 Elsevier Patient Education  2020 Elsevier Inc.        COVID-19: How to Protect Yourself and Others Know how it spreads  There is currently no vaccine to prevent coronavirus disease 2019 (COVID-19).  The best way to prevent illness is to avoid being exposed to this virus.  The virus is thought to spread mainly from person-to-person. ? Between people who are in close contact with one another (within about 6 feet). ? Through respiratory droplets produced when an infected person coughs, sneezes or talks. ? These droplets can land in the mouths or noses of people who are nearby or possibly be inhaled into the lungs. ? COVID-19 may be spread by people who are not showing symptoms. Everyone should Clean your hands often  Wash your hands often with soap and water for at least 20 seconds especially after you have been in a public place, or after blowing your nose, coughing, or sneezing.  If soap and water are not readily available, use a hand sanitizer that contains at least 60% alcohol. Cover all surfaces of your hands and rub them together until they feel dry.  Avoid touching your eyes, nose, and mouth with unwashed hands. Avoid close contact  Limit contact with others as much as possible.  Avoid close contact with people who are sick.  Put distance between yourself and other people. ? Remember that some people without symptoms may be able to spread virus. ? This is especially important for people who are at higher risk of getting very RetroStamps.it Cover your mouth and nose with a mask when around others  You could spread COVID-19 to others even if you do not feel sick.  Everyone should wear a mask in public settings and when around people not living in  their household, especially  when social distancing is difficult to maintain. ? Masks should not be placed on young children under age 61, anyone who has trouble breathing, or is unconscious, incapacitated or otherwise unable to remove the mask without assistance.  The mask is meant to protect other people in case you are infected.  Do NOT use a facemask meant for a Dietitian.  Continue to keep about 6 feet between yourself and others. The mask is not a substitute for social distancing. Cover coughs and sneezes  Always cover your mouth and nose with a tissue when you cough or sneeze or use the inside of your elbow.  Throw used tissues in the trash.  Immediately wash your hands with soap and water for at least 20 seconds. If soap and water are not readily available, clean your hands with a hand sanitizer that contains at least 60% alcohol. Clean and disinfect  Clean AND disinfect frequently touched surfaces daily. This includes tables, doorknobs, light switches, countertops, handles, desks, phones, keyboards, toilets, faucets, and sinks. RackRewards.fr  If surfaces are dirty, clean them: Use detergent or soap and water prior to disinfection.  Then, use a household disinfectant. You can see a list of EPA-registered household disinfectants here. michellinders.com 06/12/2019 This information is not intended to replace advice given to you by your health care provider. Make sure you discuss any questions you have with your health care provider. Document Revised: 06/20/2019 Document Reviewed: 04/18/2019 Elsevier Patient Education  [place].         COVID-19 Frequently Asked Questions COVID-19 (coronavirus disease) is an infection that is caused by a large family of viruses. Some viruses cause illness in people and others cause illness in animals like camels, cats, and bats. In some cases, the viruses  that cause illness in animals can spread to humans. Where did the coronavirus come from? In December 2019, [place] told the [facility]) of several cases of lung disease (human respiratory illness). These cases were linked to an open seafood and livestock market in the city of [place]. The link to the seafood and livestock market suggests that the virus may have spread from animals to humans. However, since that first outbreak in December, the virus has also been shown to spread from person to person. What is the name of the disease and the virus? Disease name Early on, this disease was called novel coronavirus. This is because scientists determined that the disease was caused by a new (novel) respiratory virus. The World Health Organization [facility]) has now named the disease COVID-19, or coronavirus disease. Virus name The virus that causes the disease is called severe acute respiratory syndrome coronavirus 2 (SARS-CoV-2). More information on disease and virus naming World Health Organization [facility]): www.who.int/emergencies/diseases/novel-coronavirus-2019/technical-guidance/naming-the-coronavirus-disease-(covid-2019)-and-the-virus-that-causes-it Who is at risk for complications from coronavirus disease? Some people may be at higher risk for complications from coronavirus disease. This includes older adults and people who have chronic diseases, such as heart disease, diabetes, and lung disease. If you are at higher risk for complications, take these extra precautions:  Stay home as much as possible.  Avoid social gatherings and travel.  Avoid close contact with others. Stay at least 6 ft (2 m) away from others, if possible.  Wash your hands often with soap and water for at least 20 seconds.  Avoid touching your face, mouth, nose, or eyes.  Keep supplies on hand at home, such as food, medicine, and cleaning supplies.  If you must go out in public, wear a cloth  face covering or  face mask. Make sure your mask covers your nose and mouth. How does coronavirus disease spread? The virus that causes coronavirus disease spreads easily from person to person (is contagious). You may catch the virus by:  Breathing in droplets from an infected person. Droplets can be spread by a person breathing, speaking, singing, coughing, or sneezing.  Touching something, like a table or a doorknob, that was exposed to the virus (contaminated) and then touching your mouth, nose, or eyes. Can I get the virus from touching surfaces or objects? There is still a lot that we do not know about the virus that causes coronavirus disease. Scientists are basing a lot of information on what they know about similar viruses, such as:  Viruses cannot generally survive on surfaces for long. They need a human body (host) to survive.  It is more likely that the virus is spread by close contact with people who are sick (direct contact), such as through: ? Shaking hands or hugging. ? Breathing in respiratory droplets that travel through the air. Droplets can be spread by a person breathing, speaking, singing, coughing, or sneezing.  It is less likely that the virus is spread when a person touches a surface or object that has the virus on it (indirect contact). The virus may be able to enter the body if the person touches a surface or object and then touches his or her face, eyes, nose, or mouth. Can a person spread the virus without having symptoms of the disease? It may be possible for the virus to spread before a person has symptoms of the disease, but this is most likely not the main way the virus is spreading. It is more likely for the virus to spread by being in close contact with people who are sick and breathing in the respiratory droplets spread by a person breathing, speaking, singing, coughing, or sneezing. What are the symptoms of coronavirus disease? Symptoms vary from person to person and can range  from mild to severe. Symptoms may include:  Fever or chills.  Cough.  Difficulty breathing or feeling short of breath.  Headaches, body aches, or muscle aches.  Runny or stuffy (congested) nose.  Sore throat.  New loss of taste or smell.  Nausea, vomiting, or diarrhea. These symptoms can appear anywhere from 2 to 14 days after you have been exposed to the virus. Some people may not have any symptoms. If you develop symptoms, call your health care provider. People with severe symptoms may need hospital care. Should I be tested for this virus? Your health care provider will decide whether to test you based on your symptoms, history of exposure, and your risk factors. How does a health care provider test for this virus? Health care providers will collect samples to send for testing. Samples may include:  Taking a swab of fluid from the back of your nose and throat, your nose, or your throat.  Taking fluid from the lungs by having you cough up mucus (sputum) into a sterile cup.  Taking a blood sample. Is there a treatment or vaccine for this virus? Currently, there is no vaccine to prevent coronavirus disease. Also, there are no medicines like antibiotics or antivirals to treat the virus. A person who becomes sick is given supportive care, which means rest and fluids. A person may also relieve his or her symptoms by using over-the-counter medicines that treat sneezing, coughing, and runny nose. These are the same medicines that a person  takes for the common cold. If you develop symptoms, call your health care provider. People with severe symptoms may need hospital care. What can I do to protect myself and my family from this virus?     You can protect yourself and your family by taking the same actions that you would take to prevent the spread of other viruses. Take the following actions:  Wash your hands often with soap and water for at least 20 seconds. If soap and water are not  available, use alcohol-based hand sanitizer.  Avoid touching your face, mouth, nose, or eyes.  Cough or sneeze into a tissue, sleeve, or elbow. Do not cough or sneeze into your hand or the air. ? If you cough or sneeze into a tissue, throw it away immediately and wash your hands.  Disinfect objects and surfaces that you frequently touch every day.  Stay away from people who are sick.  Avoid going out in public, follow guidance from your state and local health authorities.  Avoid crowded indoor spaces. Stay at least 6 ft (2 m) away from others.  If you must go out in public, wear a cloth face covering or face mask. Make sure your mask covers your nose and mouth.  Stay home if you are sick, except to get medical care. Call your health care provider before you get medical care. Your health care provider will tell you how long to stay home.  Make sure your vaccines are up to date. Ask your health care provider what vaccines you need. What should I do if I need to travel? Follow travel recommendations from your local health authority, the CDC, and WHO. Travel information and advice  Centers for Disease Control and Prevention (CDC): GeminiCard.glwww.cdc.gov/coronavirus/2019-ncov/travelers/index.html  World Health Organization [facility](WHO): PreviewDomains.sewww.who.int/emergencies/diseases/novel-coronavirus-2019/travel-advice Know the risks and take action to protect your health  You are at higher risk of getting coronavirus disease if you are traveling to areas with an outbreak or if you are exposed to travelers from areas with an outbreak.  Wash your hands often and practice good hygiene to lower the risk of catching or spreading the virus. What should I do if I am sick? General instructions to stop the spread of infection  Wash your hands often with soap and water for at least 20 seconds. If soap and water are not available, use alcohol-based hand sanitizer.  Cough or sneeze into a tissue, sleeve, or elbow. Do not cough  or sneeze into your hand or the air.  If you cough or sneeze into a tissue, throw it away immediately and wash your hands.  Stay home unless you must get medical care. Call your health care provider or local health authority before you get medical care.  Avoid public areas. Do not take public transportation, if possible.  If you can, wear a mask if you must go out of the house or if you are in close contact with someone who is not sick. Make sure your mask covers your nose and mouth. Keep your home clean  Disinfect objects and surfaces that are frequently touched every day. This may include: ? Counters and tables. ? Doorknobs and light switches. ? Sinks and faucets. ? Electronics such as phones, remote controls, keyboards, computers, and tablets.  Wash dishes in hot, soapy water or use a dishwasher. Air-dry your dishes.  Wash laundry in hot water. Prevent infecting other household members  Let healthy household members care for children and pets, if possible. If you have to care  for children or pets, wash your hands often and wear a mask.  Sleep in a different bedroom or bed, if possible.  Do not share personal items, such as razors, toothbrushes, deodorant, combs, brushes, towels, and washcloths. Where to find more information Centers for Disease Control and Prevention (CDC)  Information and news updates: CardRetirement.cz World Health Organization [facility])  Information and news updates: AffordableSalon.es  Coronavirus health topic: https://thompson-craig.com/  Questions and answers on COVID-19: kruiseway.com  Global tracker: who.sprinklr.com American Academy of Pediatrics (AAP)  Information for families: www.healthychildren.org/English/health-issues/conditions/chest-lungs/Pages/2019-Novel-Coronavirus.aspx The coronavirus situation is changing rapidly. Check your local health  authority website or the CDC and [facility] websites for updates and news. When should I contact a health care provider?  Contact your health care provider if you have symptoms of an infection, such as fever or cough, and you: ? Have been near anyone who is known to have coronavirus disease. ? Have come into contact with a person who is suspected to have coronavirus disease. ? Have traveled to an area where there is an outbreak of COVID-19. When should I get emergency medical care?  Get help right away by calling your local emergency services (911 in the [place] if you have: ? Trouble breathing. ? Pain or pressure in your chest. ? Confusion. ? Blue-tinged lips and fingernails. ? Difficulty waking from sleep. ? Symptoms that get worse. Let the emergency medical personnel know if you think you have coronavirus disease. Summary  A new respiratory virus is spreading from person to person and causing COVID-19 (coronavirus disease).  The virus that causes COVID-19 appears to spread easily. It spreads from one person to another through droplets from breathing, speaking, singing, coughing, or sneezing.  Older adults and those with chronic diseases are at higher risk of disease. If you are at higher risk for complications, take extra precautions.  There is currently no vaccine to prevent coronavirus disease. There are no medicines, such as antibiotics or antivirals, to treat the virus.  You can protect yourself and your family by washing your hands often, avoiding touching your face, and covering your coughs and sneezes. This information is not intended to replace advice given to you by your health care provider. Make sure you discuss any questions you have with your health care provider. Document Revised: 07/26/2019 Document Reviewed: 01/22/2019 Elsevier Patient Education  2020 Elsevier Inc.        COVID-19: Quarantine vs. Isolation QUARANTINE keeps someone who was in close contact with  someone who has COVID-19 away from others. If you had close contact with a person who has COVID-19  Stay home until 14 days after your last contact.  Check your temperature twice a day and watch for symptoms of COVID-19.  If possible, stay away from people who are at higher-risk for getting very sick from COVID-19. ISOLATION keeps someone who is sick or tested positive for COVID-19 without symptoms away from others, even in their own home. If you are sick and think or know you have COVID-19  Stay home until after ? At least 10 days since symptoms first appeared and ? At least 24 hours with no fever without fever-reducing medication and ? Symptoms have improved If you tested positive for COVID-19 but do not have symptoms  Stay home until after ? 10 days have passed since your positive test If you live with others, stay in a specific "sick room" or area and away from other people or animals, including pets. Use a separate bathroom, if available. South[place]Flowers.co.uk  04/29/2019 This information is not intended to replace advice given to you by your health care provider. Make sure you discuss any questions you have with your health care provider. Document Revised: 09/12/2019 Document Reviewed: 09/12/2019 Elsevier Patient Education  2020 Elsevier Inc.        Subchorionic Hematoma  A subchorionic hematoma is a gathering of blood between the outer wall of the embryo (chorion) and the inner wall of the womb (uterus). This condition can cause vaginal bleeding. If they cause little or no vaginal bleeding, early small hematomas usually shrink on their own and do not affect your baby or pregnancy. When bleeding starts later in pregnancy, or if the hematoma is larger or occurs in older pregnant women, the condition may be more serious. Larger hematomas may get bigger, which increases the chances of miscarriage. This condition also increases the risk of:  Premature separation of the placenta  from the uterus.  Premature (preterm) labor.  Stillbirth. What are the causes? The exact cause of this condition is not known. It occurs when blood is trapped between the placenta and the uterine wall because the placenta has separated from the original site of implantation. What increases the risk? You are more likely to develop this condition if:  You were treated with fertility medicines.  You conceived through in vitro fertilization (IVF). What are the signs or symptoms? Symptoms of this condition include:  Vaginal spotting or bleeding.  Contractions of the uterus. These cause abdominal pain. Sometimes you may have no symptoms and the bleeding may only be seen when ultrasound images are taken (transvaginal ultrasound). How is this diagnosed? This condition is diagnosed based on a physical exam. This includes a pelvic exam. You may also have other tests, including:  Blood tests.  Urine tests.  Ultrasound of the abdomen. How is this treated? Treatment for this condition can vary. Treatment may include:  Watchful waiting. You will be monitored closely for any changes in bleeding. During this stage: ? The hematoma may be reabsorbed by the body. ? The hematoma may separate the fluid-filled space containing the embryo (gestational sac) from the wall of the womb (endometrium).  Medicines.  Activity restriction. This may be needed until the bleeding stops. Follow these instructions at home:  Stay on bed rest if told to do so by your health care provider.  Do not lift anything that is heavier than 10 lbs. (4.5 kg) or as told by your health care provider.  Do not use any products that contain nicotine or tobacco, such as cigarettes and e-cigarettes. If you need help quitting, ask your health care provider.  Track and write down the number of pads you use each day and how soaked (saturated) they are.  Do not use tampons.  Keep all follow-up visits as told by your health  care provider. This is important. Your health care provider may ask you to have follow-up blood tests or ultrasound tests or both. Contact a health care provider if:  You have any vaginal bleeding.  You have a fever. Get help right away if:  You have severe cramps in your stomach, back, abdomen, or pelvis.  You pass large clots or tissue. Save any tissue for your health care provider to look at.  You have more vaginal bleeding, and you faint or become lightheaded or weak. Summary  A subchorionic hematoma is a gathering of blood between the outer wall of the placenta and the uterus.  This condition can cause vaginal bleeding.  Sometimes you may have no symptoms and the bleeding may only be seen when ultrasound images are taken.  Treatment may include watchful waiting, medicines, or activity restriction. This information is not intended to replace advice given to you by your health care provider. Make sure you discuss any questions you have with your health care provider. Document Revised: 09/08/2017 Document Reviewed: 11/22/2016 Elsevier Patient Education  2020 [employer].        Marijuana Use During Pregnancy and Breastfeeding  Marijuana is the dried leaves, flowers, and stems of the Cannabis sativa or Cannabis indica plant. The plant's active ingredients (cannabinoids), including a chemical called THC, change the chemistry of the brain. Marijuana smoke also has many of the same chemicals as cigarette smoke that cause breathing problems. Marijuana gets into your blood through your lungs when you smoke it and through your digestive system when you swallow it. Using marijuana in any form may be harmful for you and your baby when you are trying to become pregnant and during pregnancy. This includes marijuana that is prescribed to you by a health care provider (medical marijuana). Once marijuana is in your blood, it can travel through your placenta to your baby. It may also pass  through breast milk. How does this affect me? Marijuana affects you both mentally and physically. Using marijuana can make you feel high and relaxed. It can also have negative effects, especially at high doses or with long-term use. These include:  Rapid heartbeat and stress on your heart.  Lung irritation and breathing problems.  Difficulty thinking and making decisions.  Seeing or believing things that are not true (hallucinations and paranoia).  Mood swings, depression, or anxiety.  Decreased ability to learn and remember.  Difficulty getting pregnant. Marijuana can also affect your pregnancy. Not all the effects are known. However, if you use marijuana during pregnancy, you may:  Be less likely to get regular prenatal care and do the things that you need to do to have a healthy pregnancy.  Be more likely to use other drugs that can harm your pregnancy, like drinking alcohol and smoking cigarettes.  Be at higher risk of having your baby die after 28 weeks of pregnancy (stillbirth).  Be at higher risk of giving birth before 37 weeks of pregnancy (premature birth). How does this affect my baby? If you use marijuana during pregnancy, this may affect your baby's development, birth, and life after birth. Your baby may:  Be born prematurely, which can cause physical and mental problems.  Be born with a low birth weight, which can lead to physical and mental problems.  Have problems with brain development.  Have difficulty growing.  Have attention and behavior problems later in life.  Do poorly at school and have learning problems later in life.  Have problems with vision and coordination.  Be at higher risk for using marijuana by age 35. More research is needed to find out exactly how marijuana affects a baby during breastfeeding. Some studies suggest that the chemicals in marijuana can be passed to a baby through breast milk. To limit possible risks, you should not use  marijuana during breastfeeding. Follow these instructions at home:  Let your health care provider know if you use marijuana before trying to get pregnant, during pregnancy, or during breastfeeding.  Do not use marijuana in any form when you are trying to get pregnant, when you are pregnant, or when you are breastfeeding. If you are having trouble stopping marijuana use, ask your  health care provider for help.  Do not smoke. If you need help quitting, ask your health care provider for help.  If you are using medical marijuana, ask your health care provider to switch you to a medicine that is safer to use during pregnancy or breastfeeding.  Keep all your prenatal visits as told by your health care provider. This is important. Where to find more information [employer] on Drug Abuse: www.drugabuse.gov March of Dimes: www.marchofdimes.org/pregnancy Contact a health care provider if:  You use marijuana and want to get pregnant.  You use marijuana during pregnancy or breastfeeding.  You need help stopping marijuana use. Get help right away if:  Your baby is not gaining weight or growing as expected. Summary  Using marijuana in any form may be harmful for you and your baby when you are trying to become pregnant, during pregnancy, and during breastfeeding. This includes marijuana that is prescribed to you (medical marijuana).  Some studies suggest that marijuana may pass through breast milk and can affect your baby's brain development.  Talk to your health care provider if you use marijuana in any form while trying to get pregnant, during pregnancy, or while breastfeeding.  Ask your health care provider for help if you are not able to stop using marijuana. This information is not intended to replace advice given to you by your health care provider. Make sure you discuss any questions you have with your health care provider. Document Revised: 01/18/2019 Document Reviewed:  06/14/2017 Elsevier Patient Education  2020 [employer].

## 2020-10-15 NOTE — MAU Provider Note (Signed)
History     CSN: [id number]  Arrival date and time: 10/15/20 0841   Event Date/Time   First Provider Initiated Contact with Patient 10/15/20 1120      Chief Complaint  Patient presents with  . Nausea  . Chills  . Shortness of Breath   Ms. Connie Shea is a 25 y.o. G3P0101 at [redacted]w[redacted]d who presents to MAU for nausea, vomiting and chills. Patient reports symptoms started about one week ago and she has been vomiting about 5 times daily. Patient reports she is able to drink water and coke, but is not able to eat much not due to vomiting, but because she does not have an appetite for food like she did before she found out she was pregnant a week ago. Patient denies changes in taste or smell. Patient also endorses smoking marijuana, but reports she stopped one week ago when she found out she was pregnant. Of note, patient not actively vomiting while in MAU.  Pt denies VB, LOF, ctx, decreased FM, vaginal discharge/odor/itching. Pt denies abdominal pain, constipation, diarrhea, or urinary problems. Pt denies fever, fatigue, sweating or changes in appetite. Pt denies SOB or chest pain. Pt denies dizziness, HA, light-headedness, weakness.  Problems this pregnancy include: patient has not yet been seen Allergies? NKDA Current medications/supplements? Valtrex   OB History    Gravida  3   Para  1   Term      Preterm  1   AB      Living  1     SAB      IAB      Ectopic      Multiple      Live Births  1           Past Medical History:  Diagnosis Date  . Acute pyelonephritis 07/19/2016  . Anemia   . Depression   . Genital herpes   . History of acute PID   . Kidney infection   . Obesity   . Pelvic pain 02/13/2017  . Pre-eclampsia   . Sexually transmitted disease   . Sickle cell trait (HCC)   . Stutter     Past Surgical History:  Procedure Laterality Date  . CESAREAN SECTION    . CESAREAN SECTION      Family History  Problem Relation Age of Onset  .  Hypertension Mother   . Asthma Mother   . Hypertension Maternal Aunt     Social History   Tobacco Use  . Smoking status: Never Smoker  . Smokeless tobacco: Never Used  Substance Use Topics  . Alcohol use: Not Currently    Comment: 1 day a week  . Drug use: Not Currently    Types: Marijuana    Allergies:  Allergies  Allergen Reactions  . Phentermine Hcl Other (See Comments)    Tachycardia, Psychosis    Medications Prior to Admission  Medication Sig Dispense Refill Last Dose  . metoCLOPramide (REGLAN) 10 MG tablet Take 10 mg by mouth 4 (four) times daily.   Past Week at Unknown time  . valACYclovir (VALTREX) 500 MG tablet Take 500 mg by mouth daily. Prescribed at [facility], pt to take for 7 days.       Review of Systems  Constitutional: Positive for chills. Negative for diaphoresis, fatigue and fever.  Eyes: Negative for visual disturbance.  Respiratory: Negative for shortness of breath.   Cardiovascular: Negative for chest pain.  Gastrointestinal: Positive for nausea and vomiting. Negative for abdominal  pain, constipation and diarrhea.  Genitourinary: Negative for dysuria, flank pain, frequency, pelvic pain, urgency, vaginal bleeding and vaginal discharge.  Neurological: Negative for dizziness, weakness, light-headedness and headaches.   Physical Exam   Blood pressure 136/85, pulse 78, temperature 98.4 F (36.9 C), resp. rate 16, height 5\' 4"  (1.626 m), weight 85.3 kg, last menstrual period 08/10/2020, SpO2 100 %, unknown if currently breastfeeding.  Patient Vitals for the past 24 hrs:  BP Temp Temp src Pulse Resp SpO2 Height Weight  10/15/20 1031 136/85 98.4 F (36.9 C) - 78 - 100 % - -  10/15/20 0846 (!) 152/84 99 F (37.2 C) Oral 87 16 100 % 5\' 4"  (1.626 m) 85.3 kg   Physical Exam Vitals and nursing note reviewed.  Constitutional:      General: She is not in acute distress.    Appearance: Normal appearance. She is not ill-appearing,  toxic-appearing or diaphoretic.  HENT:     Head: Normocephalic and atraumatic.  Pulmonary:     Effort: Pulmonary effort is normal.  Neurological:     Mental Status: She is alert and oriented to person, place, and time.  Psychiatric:        Mood and Affect: Mood normal.        Behavior: Behavior normal.        Thought Content: Thought content normal.        Judgment: Judgment normal.    Results for orders placed or performed during the hospital encounter of 10/15/20 (from the past 24 hour(s))  POC Urine Pregnancy, ED (not at [facility])     Status: Abnormal   Collection Time: 10/15/20  9:33 AM  Result Value Ref Range   Preg Test, Ur POSITIVE (A) NEGATIVE  Urinalysis, Routine w reflex microscopic Urine, Clean Catch     Status: Abnormal   Collection Time: 10/15/20 10:59 AM  Result Value Ref Range   Color, Urine YELLOW YELLOW   APPearance HAZY (A) CLEAR   Specific Gravity, Urine 1.020 1.005 - 1.030   pH 5.0 5.0 - 8.0   Glucose, UA NEGATIVE NEGATIVE mg/dL   Hgb urine dipstick NEGATIVE NEGATIVE   Bilirubin Urine NEGATIVE NEGATIVE   Ketones, ur 80 (A) NEGATIVE mg/dL   Protein, ur NEGATIVE NEGATIVE mg/dL   Nitrite NEGATIVE NEGATIVE   Leukocytes,Ua NEGATIVE NEGATIVE  Resp Panel by RT-PCR (Flu A&B, Covid) Nasopharyngeal Swab     Status: Abnormal   Collection Time: 10/15/20 10:59 AM   Specimen: Nasopharyngeal Swab; Nasopharyngeal(NP) swabs in vial transport medium  Result Value Ref Range   SARS Coronavirus 2 by RT PCR POSITIVE (A) NEGATIVE   Influenza A by PCR NEGATIVE NEGATIVE   Influenza B by PCR NEGATIVE NEGATIVE  CBC with Differential/Platelet     Status: Abnormal   Collection Time: 10/15/20  1:33 PM  Result Value Ref Range   WBC 6.1 4.0 - 10.5 K/uL   RBC 3.90 3.87 - 5.11 MIL/uL   Hemoglobin 11.8 (L) 12.0 - 15.0 g/dL   HCT 33.8 (L) 36.0 - 46.0 %   MCV 86.7 80.0 - 100.0 fL   MCH 30.3 26.0 - 34.0 pg   MCHC 34.9 30.0 - 36.0 g/dL   RDW 12.7 11.5 - 15.5 %   Platelets 251 150 - 400  K/uL   nRBC 0.0 0.0 - 0.2 %   Neutrophils Relative % 70 %   Neutro Abs 4.2 1.7 - 7.7 K/uL   Lymphocytes Relative 11 %   Lymphs Abs 0.7 0.7 - 4.0 K/uL  Monocytes Relative 18 %   Monocytes Absolute 1.1 (H) 0.1 - 1.0 K/uL   Eosinophils Relative 0 %   Eosinophils Absolute 0.0 0.0 - 0.5 K/uL   Basophils Relative 1 %   Basophils Absolute 0.1 0.0 - 0.1 K/uL   Immature Granulocytes 0 %   Abs Immature Granulocytes 0.02 0.00 - 0.07 K/uL  Comprehensive metabolic panel     Status: Abnormal   Collection Time: 10/15/20  1:33 PM  Result Value Ref Range   Sodium 137 135 - 145 mmol/L   Potassium 3.2 (L) 3.5 - 5.1 mmol/L   Chloride 102 98 - 111 mmol/L   CO2 24 22 - 32 mmol/L   Glucose, Bld 78 70 - 99 mg/dL   BUN 7 6 - 20 mg/dL   Creatinine, Ser 4.81 0.44 - 1.00 mg/dL   Calcium 9.3 8.9 - 85.6 mg/dL   Total Protein 6.9 6.5 - 8.1 g/dL   Albumin 3.9 3.5 - 5.0 g/dL   AST 18 15 - 41 U/L   ALT 21 0 - 44 U/L   Alkaline Phosphatase 43 38 - 126 U/L   Total Bilirubin 0.6 0.3 - 1.2 mg/dL   GFR, Estimated >31 >49 mL/min   Anion gap 11 5 - 15   US OB LESS THAN 14 WEEKS WITH OB TRANSVAGINAL  Result Date: 10/15/2020 CLINICAL DATA:  Question of fetal viability in a 25 year old female, G3P1 without recent beta HCG. EXAM: OBSTETRIC <14 WK [place] AND TRANSVAGINAL OB US TECHNIQUE: Both transabdominal and transvaginal ultrasound examinations were performed for complete evaluation of the gestation as well as the maternal uterus, adnexal regions, and pelvic cul-de-sac. Transvaginal technique was performed to assess early pregnancy. COMPARISON:  Previous sonogram from February of 2021 FINDINGS: Intrauterine gestational sac: Single Yolk sac:  Visualized Embryo:  Visualized Cardiac Activity: Visualized. Heart Rate: 117 bpm CRL: 7.7 mm   6 w   4 d                  [place] EDC: 05/31/2021 Subchorionic hemorrhage: Small subchorionic hematoma along the inferior margin of the gestational sac that shows some grayscale motion on clip images  in this patient with vaginal bleeding. Maternal uterus/adnexae: Signs of prior C-section. Small nabothian cysts. Normal appearance of the bilateral ovaries. IMPRESSION: Single viable intrauterine gestation with fetal heart rate of 117 beats per minute. Small subchorionic hematoma along the inferior margin of the gestational sac with some grayscale motion, this may represent a small amount of flow/bleeding into a small subchorionic hematoma or flow within immature trophoblastic tissue. Correlate with any signs of vaginal bleeding and suggest attention on follow-up. Electronically Signed   By: Donzetta Kohut M.D.   On: 10/15/2020 16:42    MAU Course  Procedures  MDM -N/V in pregnancy -weight today 85.3kg, was 86.2kg approx 1 year ago -UA: hazy/80ketones -CBC w/Diff: WNL for pregnancy -CMP: K 3.2 -COVID: POSITIVE -1L LR + 40mg  Protonix + 10mg  Reglan given, pt reports NV now resolved - : Per Dr. , pt has an IUP and can be discharged home prior to receiving final report -pt able to urinate after fluids -PO challenge successful -pt discharged to home in stable condition  Orders Placed This Encounter  Procedures  . Resp Panel by RT-PCR (Flu A&B, Covid) Nasopharyngeal Swab    Standing Status:   Standing    Number of Occurrences:   1    Order Specific Question:   Is this test for diagnosis or screening    Answer:  Diagnosis of ill patient    Order Specific Question:   Symptomatic for COVID-19 as defined by CDC    Answer:   Yes    Order Specific Question:   Date of Symptom Onset    Answer:   10/13/2020    Order Specific Question:   Hospitalized for COVID-19    Answer:   Yes    Order Specific Question:   Admitted to ICU for COVID-19    Answer:   No    Order Specific Question:   Previously tested for COVID-19    Answer:   No    Order Specific Question:   Resident in a congregate (group) care setting    Answer:   Unknown    Order Specific Question:   Employed in healthcare setting     Answer:   Unknown    Order Specific Question:   Pregnant    Answer:   Yes    Order Specific Question:   Has patient completed COVID vaccination(s) (2 doses of Pfizer/Moderna 1 dose of [employer])    Answer:   Unknown  . US OB LESS THAN 14 WEEKS WITH OB TRANSVAGINAL    Standing Status:   Standing    Number of Occurrences:   1    Order Specific Question:   Symptom/Reason for Exam    Answer:   Pregnancy with uncertain fetal viability [025852]  . Urinalysis, Routine w reflex microscopic Urine, Clean Catch    Standing Status:   Standing    Number of Occurrences:   1  . CBC with Differential/Platelet    Standing Status:   Standing    Number of Occurrences:   1  . Comprehensive metabolic panel    Standing Status:   Standing    Number of Occurrences:   1  . Airborne and Contact precautions    Standing Status:   Standing    Number of Occurrences:   1  . POC Urine Pregnancy, ED (not at [facility])    Standing Status:   Standing    Number of Occurrences:   1  . ABO/Rh    Standing Status:   Standing    Number of Occurrences:   1  . Insert peripheral IV    Standing Status:   Standing    Number of Occurrences:   1  . Discharge patient    Order Specific Question:   Discharge disposition    Answer:   01-Home or Self Care [1]    Order Specific Question:   Discharge patient date    Answer:   10/15/2020   Meds ordered this encounter  Medications  . lactated ringers bolus 1,000 mL  . pantoprazole (PROTONIX) injection 40 mg  . metoCLOPramide (REGLAN) injection 10 mg  . famotidine (PEPCID) 20 MG tablet    Sig: Take 1 tablet (20 mg total) by mouth daily.    Dispense:  30 tablet    Refill:  1    Order Specific Question:   Supervising Provider    Answer:   Mariel Aloe A [[id number]]  . metoCLOPramide (REGLAN) 10 MG tablet    Sig: Take 1 tablet (10 mg total) by mouth 3 (three) times daily with meals.    Dispense:  90 tablet    Refill:  1    Order Specific Question:   Supervising Provider     Answer:   Mariel Aloe A [[id number]]    Assessment and Plan   1. Lab test positive for  detection of COVID-19 virus   2. Pregnancy with uncertain fetal viability   3. Nausea and vomiting in pregnancy   4. Intrauterine pregnancy   5. Subchorionic hemorrhage of placenta in first trimester, single or unspecified fetus   6. Hypokalemia     Allergies as of 10/15/2020      Reactions   Phentermine Hcl Other (See Comments)   Tachycardia, Psychosis      Medication List    TAKE these medications   famotidine 20 MG tablet Commonly known as: Pepcid Take 1 tablet (20 mg total) by mouth daily.   metoCLOPramide 10 MG tablet Commonly known as: REGLAN Take 1 tablet (10 mg total) by mouth 3 (three) times daily with meals. What changed: when to take this   valACYclovir 500 MG tablet Commonly known as: VALTREX Take 500 mg by mouth daily. Prescribed at [facility], pt to take for 7 days.       -will call with culture results, if positive -discussed expected s/sx of [facility] and when to return to MAU -advised quarantine for COVID and discussed worsening s/sx requiring evaluation by ED/Urgent Care -pt advised not to restart smoking marijuana for N/V or other reason as this can worsen symptoms of N/V, information given on cannabis hyperemesis -safe meds in pregnancy list given -pt advised to take medications around the clock and not to stop taking if feeling better -discussed nonpharmacologic and pharmacologic treatments of N/V -discussed normal expectations for N/V in pregnancy -pt discharged to home in stable condition  Joni Reining E Denajah Farias 10/15/2020, 5:01 PM

## 2020-10-15 NOTE — ED Provider Notes (Signed)
MSE was initiated and I personally evaluated the patient and placed orders (if any) at  9:08 AM on October 15, 2020.  The patient appears stable so that the remainder of the MSE may be completed by another provider.  0177: I was asked to triage this patient by RN  Approx [redacted] weeks pregnant, seen by PCP last week. Has referral to OBGYN 1 week of nausea, vomiting 3-4 x so far this morning since 2 am. Has upper abdominal pain and chest pain with active vomiting Used to smoke marijuana none in  The last week No alcohol use, h/o GERD/PUD, pancreatitis, gall stones No LOF, vaginal bleeding pelvic or lower abdominal pain No sick contacts  Abdomen soft non tender. Teary eyed.   Appropriate for transfer to MAU for further care. Strong suspicion symptoms related to pregnancy.   Spoke to MAU provider accepts patient. Will await POC preg test and transport to MAU   Liberty Handy, PA-C 10/15/20 9390    Benjiman Core, MD 10/15/20 [phone]

## 2020-10-15 NOTE — MAU Note (Signed)
Pt stated she has been having n/v  For about a week took some nausea medication she had with not much relief. Stated she has chest pain and pressure with SOB No cough reported) for the past week. Also c/o having chills (not taken her temp at  Home)

## 2020-10-15 NOTE — ED Notes (Signed)
Transport contacted to take patient over to MAU 

## 2020-10-17 ENCOUNTER — Telehealth: Payer: Self-pay | Admitting: Oncology

## 2020-10-17 ENCOUNTER — Encounter: Payer: Self-pay | Admitting: Oncology

## 2020-10-17 NOTE — Telephone Encounter (Signed)
Called to discuss with patient about COVID-19 symptoms and the use of one of the available treatments for those with mild to moderate Covid symptoms and at a high risk of hospitalization.  Pt appears to qualify for outpatient treatment due to co-morbid conditions and/or a member of an at-risk group in accordance with the FDA Emergency Use Authorization.    Symptom onset: 10/15/20 Vaccinated: No  Booster? No Qualifiers: Pregnant, obeisty, hld, tn   Spoke to patient and she interested in MAB.   Mauro Kaufmann

## 2020-10-17 NOTE — Telephone Encounter (Signed)
Given shortage of MAB, if she could get clearence from her OBGYN she would be eligible for Remdisivir.   Patient has an appt on Monday and will let us know.   Durenda Hurt, NP 10/17/2020 1:23 PM Take

## 2020-10-18 ENCOUNTER — Other Ambulatory Visit: Payer: Self-pay | Admitting: Oncology

## 2020-10-18 ENCOUNTER — Encounter: Payer: Self-pay | Admitting: Oncology

## 2020-10-18 DIAGNOSIS — U071 COVID-19: Secondary | ICD-10-CM

## 2020-10-19 ENCOUNTER — Ambulatory Visit (HOSPITAL_COMMUNITY): Payer: Medicaid Other | Attending: Pulmonary Disease

## 2020-12-14 ENCOUNTER — Other Ambulatory Visit: Payer: Self-pay

## 2020-12-14 ENCOUNTER — Encounter: Payer: Self-pay | Admitting: Obstetrics and Gynecology

## 2020-12-14 ENCOUNTER — Other Ambulatory Visit (HOSPITAL_COMMUNITY)
Admission: RE | Admit: 2020-12-14 | Discharge: 2020-12-14 | Disposition: A | Discharge status: Home / Self Care | Payer: Medicaid Other | Source: Ambulatory Visit | Attending: Obstetrics and Gynecology | Admitting: Obstetrics and Gynecology

## 2020-12-14 ENCOUNTER — Ambulatory Visit (INDEPENDENT_AMBULATORY_CARE_PROVIDER_SITE_OTHER): Payer: Medicaid Other | Admitting: Obstetrics and Gynecology

## 2020-12-14 VITALS — BP 146/96 | HR 67 | Wt 177.8 lb

## 2020-12-14 DIAGNOSIS — N898 Other specified noninflammatory disorders of vagina: Secondary | ICD-10-CM

## 2020-12-14 DIAGNOSIS — Z3202 Encounter for pregnancy test, result negative: Secondary | ICD-10-CM

## 2020-12-14 DIAGNOSIS — Z01411 Encounter for gynecological examination (general) (routine) with abnormal findings: Secondary | ICD-10-CM | POA: Insufficient documentation

## 2020-12-14 LAB — POCT PREGNANCY, URINE: Preg Test, Ur: NEGATIVE

## 2020-12-14 NOTE — Progress Notes (Signed)
Subjective:     Connie Shea is a 25 y.o. female P1 with LMP 11/24/20 and BMI 30 who is here for a comprehensive physical exam. The patient reports a vaginal discharge with odor which has been present for the past 5-6 months. She admits to washing several times a day. She has used several over the counter products without improvement. She denies any pelvic pain or abnormal discharge. Patient is sexually active using condoms occasionally. She desires Nexplanon for contraception and admits to unprotected intercourse these past few days.  Past Medical History:  Diagnosis Date  . Acute pyelonephritis 07/19/2016  . Anemia   . Depression   . Genital herpes   . History of acute PID   . Kidney infection   . Obesity   . Pelvic pain 02/13/2017  . Positive urine pregnancy test 08/17/2017  . Pre-eclampsia   . Sexually transmitted disease   . Sickle cell trait (HCC)   . Stutter    Past Surgical History:  Procedure Laterality Date  . CESAREAN SECTION    . CESAREAN SECTION     Family History  Problem Relation Age of Onset  . Hypertension Mother   . Asthma Mother   . Hypertension Maternal Aunt     Social History   Socioeconomic History  . Marital status: Single    Spouse name: Not on file  . Number of children: Not on file  . Years of education: Not on file  . Highest education level: Not on file  Occupational History  . Not on file  Tobacco Use  . Smoking status: Never Smoker  . Smokeless tobacco: Never Used  Substance and Sexual Activity  . Alcohol use: Not Currently    Comment: 1 day a week  . Drug use: Not Currently    Types: Marijuana  . Sexual activity: Yes    Birth control/protection: None  Other Topics Concern  . Not on file  Social History Narrative  . Not on file   Social Determinants of Health   Financial Resource Strain: Not on file  Food Insecurity: Not on file  Transportation Needs: Not on file  Physical Activity: Not on file  Stress: Not on file  Social  Connections: Not on file  Intimate Partner Violence: Not on file   Health Maintenance  Topic Date Due  . COVID-19 Vaccine (1) Never done  . HPV VACCINES (1 - 2-dose series) Never done  . TETANUS/TDAP  Never done  . PAP-Cervical Cytology Screening  Never done  . PAP SMEAR-Modifier  Never done  . CHLAMYDIA SCREENING  08/18/2018  . INFLUENZA VACCINE  Never done  . Hepatitis C Screening  Completed  . HIV Screening  Completed       Review of Systems Pertinent items noted in HPI and remainder of comprehensive ROS otherwise negative.   Objective:  Blood pressure (!) 146/96, pulse 67, weight 177 lb 12.8 oz (80.6 kg), last menstrual period 11/24/2020, not currently breastfeeding.     GENERAL: Well-developed, well-nourished female in no acute distress.  HEENT: Normocephalic, atraumatic. Sclerae anicteric.  NECK: Supple. Normal thyroid.  LUNGS: Clear to auscultation bilaterally.  HEART: Regular rate and rhythm. BREASTS: Symmetric in size. No palpable masses or lymphadenopathy, skin changes, or nipple drainage. ABDOMEN: Soft, nontender, nondistended. No organomegaly. PELVIC: Normal external female genitalia. Vagina is pink and rugated.  Normal discharge. Normal appearing cervix. Uterus is normal in size.  No adnexal mass or tenderness. EXTREMITIES: No cyanosis, clubbing, or edema, 2+ distal pulses.  Assessment:    Healthy female exam.      Plan:    Pap smear collected Vaginal swab collected Patient will be contacted with abnormal results Patient to return for Nexplanon insertion Perineal care reviewed See After Visit Summary for Counseling Recommendations

## 2020-12-14 NOTE — Progress Notes (Signed)
Pt reports unprotected intercourse this AM. Reports spotting in February, states this was not a full period. Has not had a normal period since induced abortion in  January 2022.

## 2020-12-15 LAB — CYTOLOGY - PAP: Diagnosis: NEGATIVE

## 2021-01-04 ENCOUNTER — Ambulatory Visit (INDEPENDENT_AMBULATORY_CARE_PROVIDER_SITE_OTHER): Payer: Medicaid Other | Admitting: Obstetrics and Gynecology

## 2021-01-04 ENCOUNTER — Encounter: Payer: Self-pay | Admitting: *Deleted

## 2021-01-04 ENCOUNTER — Other Ambulatory Visit: Payer: Self-pay

## 2021-01-04 ENCOUNTER — Encounter: Payer: Self-pay | Admitting: Obstetrics and Gynecology

## 2021-01-04 VITALS — BP 151/101 | HR 75 | Ht 64.0 in

## 2021-01-04 DIAGNOSIS — Z30017 Encounter for initial prescription of implantable subdermal contraceptive: Secondary | ICD-10-CM

## 2021-01-04 LAB — POCT PREGNANCY, URINE: Preg Test, Ur: NEGATIVE

## 2021-01-04 MED ORDER — FLUCONAZOLE 150 MG PO TABS: ORAL_TABLET | ORAL | 1 refills | Status: DC

## 2021-01-04 MED ORDER — ETONOGESTREL 68 MG ~~LOC~~ IMPL
68.0000 mg | DRUG_IMPLANT | Freq: Once | SUBCUTANEOUS | Status: AC
  Administered 2021-01-04: 68 mg via SUBCUTANEOUS

## 2021-01-04 NOTE — Addendum Note (Signed)
Addended by: Guy Begin on: 01/04/2021 04:48 PM   Modules accepted: Orders

## 2021-01-04 NOTE — Progress Notes (Signed)
Patient here for Nexplanon insertion.

## 2021-01-04 NOTE — Progress Notes (Signed)
Patient ID: Connie Shea, female   DOB: Nov 15, 1995, 25 y.o.   MRN: [id number]     GYNECOLOGY CLINIC PROCEDURE NOTE  Connie Shea is a 25 y.o. [phone] here for  Nexplanon insertion.     Nexplanon Insertion Procedure Patient identified, informed consent performed, consent signed.   Patient does understand that irregular bleeding is a very common side effect of this medication. She was advised to have backup contraception for one week after placement. Pregnancy test in clinic today was negative.  Appropriate time out taken.  Patient's left arm was prepped and draped in the usual sterile fashion.. The ruler used to measure and mark insertion area.  Patient was prepped with alcohol swab and then injected with 3 ml of 1% lidocaine.  She was prepped with betadine, Nexplanon removed from packaging,  Device confirmed in needle, then inserted full length of needle and withdrawn per handbook instructions. Nexplanon was able to palpated in the patient's arm; patient palpated the insert herself. There was minimal blood loss.  Patient insertion site covered with guaze and a pressure bandage to reduce any bruising.  The patient tolerated the procedure well and was given post procedure instructions.    Nettie Elm, MD, FACOG Attending Obstetrician & Gynecologist Center for [facility], [facility]

## 2021-01-26 ENCOUNTER — Encounter: Payer: Self-pay | Admitting: Gastroenterology

## 2021-02-09 ENCOUNTER — Ambulatory Visit: Payer: Medicaid Other | Admitting: Gastroenterology

## 2021-02-09 VITALS — BP 132/74 | HR 67 | Ht 64.0 in | Wt 178.0 lb

## 2021-02-09 DIAGNOSIS — R634 Abnormal weight loss: Secondary | ICD-10-CM | POA: Diagnosis not present

## 2021-02-09 DIAGNOSIS — K5900 Constipation, unspecified: Secondary | ICD-10-CM

## 2021-02-09 DIAGNOSIS — R630 Anorexia: Secondary | ICD-10-CM | POA: Diagnosis not present

## 2021-02-09 DIAGNOSIS — R1013 Epigastric pain: Secondary | ICD-10-CM | POA: Diagnosis not present

## 2021-02-09 DIAGNOSIS — K648 Other hemorrhoids: Secondary | ICD-10-CM | POA: Insufficient documentation

## 2021-02-09 NOTE — Patient Instructions (Addendum)
If you are age 25 or older, your body mass index should be between 23-30. Your Body mass index is 30.55 kg/m. If this is out of the aforementioned range listed, please consider follow up with your Primary Care Provider.  If you are age 104 or younger, your body mass index should be between 19-25. Your Body mass index is 30.55 kg/m. If this is out of the aformentioned range listed, please consider follow up with your Primary Care Provider.   You have been scheduled for an endoscopy. Please follow written instructions given to you at your visit today. If you use inhalers (even only as needed), please bring them with you on the day of your procedure.  Medication Samples have been provided to the patient.  Drug name: Linzess        Strength:   Lot [id number] Exp.Date: 03-2021 Qty- 16  Dosing instructions: one a day, please call if you need an Rx for this.  It was a pleasure to see you today!  Thank you for trusting me with your gastrointestinal care!

## 2021-02-09 NOTE — Progress Notes (Signed)
02/09/2021 Connie Shea [id number] 1996-02-24   HISTORY OF PRESENT ILLNESS: This is a 25 year old female who is new to our office.  She is been referred here by Dr. Jackie Plum, for evaluation regarding dyspepsia and weight loss.  She tells me that she has been having epigastric abdominal pain and some nausea for about the past year or so.  She does not really have any vomiting.  She says that she just does not get hungry and she can go all day without eating.  She says she will get something to eat and then just does not want to eat it.  She has lost about 30 pounds, but says that it stabilized over the past couple of months.  She admits to smoking marijuana regularly.  She says that she had discontinued it for a period of time last summer when she was pregnant, but it did not really seem to make a difference.  She says a lot of times she smokes it to help stimulate her appetite.  She has been on pantoprazole 40 mg daily and then it was increased to twice daily, but she continues to have symptoms.  She also reports constipation.  She says that she was given samples of Linzess by her PCP, but is unsure what the dose was.  She also complains of hemorrhoids that protrude after having a bowel movement.  She obviously does strain a lot with moving her bowels.  Denies any rectal bleeding.  She uses hydrocortisone cream and then over-the-counter witch hazel wipes, Preparation H, etc.   Past Medical History:  Diagnosis Date  . Acute pyelonephritis 07/19/2016  . Anemia   . Anxiety   . Depression   . Genital herpes   . History of acute PID   . Hypertension   . Kidney infection   . Obesity   . Pelvic pain 02/13/2017  . Positive urine pregnancy test 08/17/2017  . Pre-eclampsia   . Sexually transmitted disease   . Sickle cell trait (HCC)   . Stutter    Past Surgical History:  Procedure Laterality Date  . CESAREAN SECTION    . CESAREAN SECTION      reports that she has never smoked.  She has never used smokeless tobacco. She reports previous alcohol use. She reports current drug use. Drug: Marijuana. family history includes Asthma in her mother; Hypertension in her maternal aunt and mother. Allergies  Allergen Reactions  . Phentermine Hcl Other (See Comments)    Tachycardia, Psychosis      Outpatient Encounter Medications as of 02/09/2021  Medication Sig  . hydrocortisone (ANUSOL-HC) 2.5 % rectal cream Place 1 application rectally 2 (two) times daily.  . iron polysaccharides (NIFEREX) 150 MG capsule Take 150 mg by mouth daily.  . pantoprazole (PROTONIX) 40 MG tablet Take 40 mg by mouth 2 (two) times daily.  . [DISCONTINUED] famotidine (PEPCID) 20 MG tablet Take 1 tablet (20 mg total) by mouth daily.  . [DISCONTINUED] fluconazole (DIFLUCAN) 150 MG tablet Repeat in 3 days  . [DISCONTINUED] Probiotic Product (PROBIOTIC PO) Take by mouth.   No facility-administered encounter medications on file as of 02/09/2021.     REVIEW OF SYSTEMS  : All other systems reviewed and negative except where noted in the History of Present Illness.   PHYSICAL EXAM: BP 132/74   Pulse 67   Ht 5\' 4"  (1.626 m)   Wt 178 lb (80.7 kg)   BMI 30.55 kg/m  General: Well developed AA female in  no acute distress Head: Normocephalic and atraumatic Eyes:  Sclerae anicteric, conjunctiva pink. Ears: Normal auditory acuity Lungs: Clear throughout to auscultation; no W/R/R. Heart: Regular rate and rhythm; no M/R/G. Abdomen: Soft, non-distended.  BS present.  Non-tender. Rectal:  Small external hemorrhoids noted that are somewhat firm and tender.  DRE did not reveal any masses.  Anoscopy showed small to medium sized internal hemorrhoids. Musculoskeletal: Symmetrical with no gross deformities  Skin: No lesions on visible extremities Extremities: No edema  Neurological: Alert oriented x 4, grossly non-focal Psychological:  Alert and cooperative. Normal mood and affect  ASSESSMENT AND  PLAN: *Epigastric abdominal pain with nausea, poor appetite, weight loss.  She reports having lost 30 pounds, but it has stabilized over the past couple months.  She continues with symptoms despite pantoprazole twice daily.  Question if the nausea is due to her marijuana use.  We discussed trying to discontinue that for a few months to see if that helps.  Nonetheless, that should not be causing her epigastric abdominal pain.  We will plan for EGD for evaluation.  This is being scheduled Dr. Adela Lank.  The risks, benefits, and alternatives to EGD were discussed with the patient and she consents to proceed.  *Constipation: We will try Linzess 145 mcg daily.  She was given of samples for 2 weeks.  She will let us know how it seems to be working and then we can send a prescription. *Hemorrhoids: Small, firm, tender external hemorrhoids as well as small to medium internal hemorrhoid seen on exam today, but nothing extremely acutely inflamed.  She denies bleeding.  She can continue her hydrocortisone cream internally and externally as needed, Preparation H, witch hazel, etc.  Ultimately treating the constipation will hopefully help them from becoming irritated, etc. in the future.   CC:  Jackie Plum, MD

## 2021-02-10 NOTE — Progress Notes (Signed)
Agree with assessment and plan as outlined.  

## 2021-02-23 ENCOUNTER — Other Ambulatory Visit: Payer: Self-pay

## 2021-02-23 ENCOUNTER — Encounter: Payer: Self-pay | Admitting: Gastroenterology

## 2021-02-23 ENCOUNTER — Ambulatory Visit (AMBULATORY_SURGERY_CENTER): Payer: Medicaid Other | Admitting: Gastroenterology

## 2021-02-23 VITALS — BP 144/81 | HR 72 | Temp 98.2°F | Resp 28 | Ht 68.0 in | Wt 178.0 lb

## 2021-02-23 DIAGNOSIS — R630 Anorexia: Secondary | ICD-10-CM

## 2021-02-23 DIAGNOSIS — R1013 Epigastric pain: Secondary | ICD-10-CM

## 2021-02-23 DIAGNOSIS — K2970 Gastritis, unspecified, without bleeding: Secondary | ICD-10-CM | POA: Diagnosis not present

## 2021-02-23 DIAGNOSIS — K2950 Unspecified chronic gastritis without bleeding: Secondary | ICD-10-CM | POA: Diagnosis not present

## 2021-02-23 DIAGNOSIS — K449 Diaphragmatic hernia without obstruction or gangrene: Secondary | ICD-10-CM | POA: Diagnosis not present

## 2021-02-23 DIAGNOSIS — R110 Nausea: Secondary | ICD-10-CM

## 2021-02-23 MED ORDER — ONDANSETRON 4 MG PO TBDP: 4.0000 mg | ORAL_TABLET | Freq: Four times a day (QID) | ORAL | 3 refills | Status: AC | PRN

## 2021-02-23 MED ORDER — SODIUM CHLORIDE 0.9 % IV SOLN: 500.0000 mL | Freq: Once | INTRAVENOUS | Status: DC

## 2021-02-23 MED ORDER — ONDANSETRON HCL 4 MG PO TABS
4.0000 mg | ORAL_TABLET | Freq: Once | ORAL | Status: AC
Start: 2021-02-23 — End: 2021-02-23
  Administered 2021-02-23: 4 mg via ORAL

## 2021-02-23 MED ORDER — LINACLOTIDE 145 MCG PO CAPS: 145.0000 ug | ORAL_CAPSULE | Freq: Every day | ORAL | 3 refills | Status: AC

## 2021-02-23 NOTE — Progress Notes (Signed)
PT taken to PACU. Monitors in place. VSS. Report given to RN. 

## 2021-02-23 NOTE — Progress Notes (Signed)
Pt vomited after sipping some water at 840, order for zofran, pt rests, d/c at 905, knows to pick up rx for zofran.

## 2021-02-23 NOTE — Op Note (Signed)
[facility] Patient Name: Connie Shea Procedure Date: 02/23/2021 7:31 AM MRN: [id number] Endoscopist: Viviann Spare P. Adela Lank , MD Age: 25 Referring MD:  Date of Birth: 01-Jun-1996 Gender: Female Account #: [id number] Procedure:                Upper GI endoscopy Indications:              Epigastric abdominal pain, Anorexia, Nausea, Weight                            loss - symptoms persist despite trial of protonix                            twice daily, patient endorses daily marijuana use Medicines:                Monitored Anesthesia Care Procedure:                Pre-Anesthesia Assessment:                           - Prior to the procedure, a History and Physical                            was performed, and patient medications and                            allergies were reviewed. The patient's tolerance of                            previous anesthesia was also reviewed. The risks                            and benefits of the procedure and the sedation                            options and risks were discussed with the patient.                            All questions were answered, and informed consent                            was obtained. Prior Anticoagulants: The patient has                            taken no previous anticoagulant or antiplatelet                            agents. ASA Grade Assessment: II - A patient with                            mild systemic disease. After reviewing the risks                            and benefits, the patient was deemed in  satisfactory condition to undergo the procedure.                           After obtaining informed consent, the endoscope was                            passed under direct vision. Throughout the                            procedure, the patient's blood pressure, pulse, and                            oxygen saturations were monitored continuously. The                             Endoscope was introduced through the mouth, and                            advanced to the second part of duodenum. The upper                            GI endoscopy was accomplished without difficulty.                            The patient tolerated the procedure well. Scope In: Scope Out: Findings:                 Esophagogastric landmarks were identified: the                            Z-line was found at 34 cm, the gastroesophageal                            junction was found at 34 cm and the upper extent of                            the gastric folds was found at 35 cm from the                            incisors.                           A 1 cm hiatal hernia was present.                           The exam of the esophagus was otherwise normal.                           The entire examined stomach was normal. Biopsies                            were taken with a cold forceps for Helicobacter  pylori testing.                           The duodenal bulb and second portion of the                            duodenum were normal. Complications:            No immediate complications. Estimated blood loss:                            Minimal. Estimated Blood Loss:     Estimated blood loss was minimal. Impression:               - Esophagogastric landmarks identified.                           - 1 cm hiatal hernia.                           - Normal esophagus otherwise                           - Normal stomach. Biopsied to rule out H pylori.                           - Normal duodenal bulb and second portion of the                            duodenum.                           No overt cause for symptoms noted on this exam.                            Suspect the patient could have cannabinoid                            hyperemesis syndrome. Recommendation:           - Patient has a contact number available for                            emergencies. The  signs and symptoms of potential                            delayed complications were discussed with the                            patient. Return to normal activities tomorrow.                            Written discharge instructions were provided to the                            patient.                           -  Resume previous diet.                           - Continue present medications.                           - Trial of Zofran 4mg  ODT every 6 hours PRN                           - Trial of topical capsaicin cream applied to the                            abdominal wall for flare of symptoms                           - Recommend complete cessation of all marijuana at                            this time. Educational handout to be provided for                            cannabinoid hyperemesis syndrome                           - Await pathology results with further                            recommendations                           - Will refill Linzess for constipation which has                            significantly helped that particular symptom P. Viviann Spare, MD 02/23/2021 8:22:25 AM This report has been signed electronically.

## 2021-02-23 NOTE — Progress Notes (Signed)
Called to room to assist during endoscopic procedure.  Patient ID and intended procedure confirmed with present staff. Received instructions for my participation in the procedure from the performing physician.  

## 2021-02-23 NOTE — Progress Notes (Signed)
Pt's states no medical or surgical changes since previsit or office visit.  PH IV. 

## 2021-02-23 NOTE — Patient Instructions (Signed)
Handout given for Cannabinoid hyperemesis syndrome.  Pick up Rx for Zofran.   YOU HAD AN ENDOSCOPIC PROCEDURE TODAY AT THE [facility]:   Refer to the procedure report that was given to you for any specific questions about what was found during the examination.  If the procedure report does not answer your questions, please call your gastroenterologist to clarify.  If you requested that your care partner not be given the details of your procedure findings, then the procedure report has been included in a sealed envelope for you to review at your convenience later.  YOU SHOULD EXPECT: Some feelings of bloating in the abdomen. Passage of more gas than usual.  Walking can help get rid of the air that was put into your GI tract during the procedure and reduce the bloating. If you had a lower endoscopy (such as a colonoscopy or flexible sigmoidoscopy) you may notice spotting of blood in your stool or on the toilet paper. If you underwent a bowel prep for your procedure, you may not have a normal bowel movement for a few days.  Please Note:  You might notice some irritation and congestion in your nose or some drainage.  This is from the oxygen used during your procedure.  There is no need for concern and it should clear up in a day or so.  SYMPTOMS TO REPORT IMMEDIATELY:   Following upper endoscopy (EGD)  Vomiting of blood or coffee ground material  New chest pain or pain under the shoulder blades  Painful or persistently difficult swallowing  New shortness of breath  Fever of 100F or higher  Black, tarry-looking stools  For urgent or emergent issues, a gastroenterologist can be reached at any hour by calling [phone]. Do not use MyChart messaging for urgent concerns.    DIET:  We do recommend a small meal at first, but then you may proceed to your regular diet.  Drink plenty of fluids but you should avoid alcoholic beverages for 24 hours.  ACTIVITY:  You should plan to take  it easy for the rest of today and you should NOT DRIVE or use heavy machinery until tomorrow (because of the sedation medicines used during the test).    FOLLOW UP: Our staff will call the number listed on your records 48-72 hours following your procedure to check on you and address any questions or concerns that you may have regarding the information given to you following your procedure. If we do not reach you, we will leave a message.  We will attempt to reach you two times.  During this call, we will ask if you have developed any symptoms of COVID 19. If you develop any symptoms (ie: fever, flu-like symptoms, shortness of breath, cough etc.) before then, please call [phone].  If you test positive for Covid 19 in the 2 weeks post procedure, please call and report this information to [place].    If any biopsies were taken you will be contacted by phone or by letter within the next 1-3 weeks.  Please call us at [phone] if you have not heard about the biopsies in 3 weeks.    SIGNATURES/CONFIDENTIALITY: You and/or your care partner have signed paperwork which will be entered into your electronic medical record.  These signatures attest to the fact that that the information above on your After Visit Summary has been reviewed and is understood.  Full responsibility of the confidentiality of this discharge information lies with you and/or your care-partner.

## 2021-02-25 ENCOUNTER — Telehealth: Payer: Self-pay | Admitting: *Deleted

## 2021-02-25 ENCOUNTER — Telehealth: Payer: Self-pay

## 2021-02-25 NOTE — Telephone Encounter (Signed)
First attempt follow up call to pt, lm on vm 

## 2021-02-25 NOTE — Telephone Encounter (Signed)
Attempted to call patient back regarding her questions on loss of appetite. I left her a message stating that we were waiting on pathology findings for further recommendations.

## 2021-02-25 NOTE — Telephone Encounter (Signed)
PT would like to speak to you regarding her loss of appetite. Please give this pt a call. Thank you

## 2021-04-05 ENCOUNTER — Telehealth: Payer: Self-pay

## 2021-04-06 NOTE — Telephone Encounter (Signed)
Aptima swab found in office labelled for this patient with date of 12/14/20. Per chart review, cervicovaginal ancillary swab ordered and not resulted. Per chart review, pt was treated for yeast infection later in March based on symptoms. Safety zone completed to report event. Called pt to explain that swab was not sent to lab during visit in March. Pt reports continued vaginal symptoms and would like to come into office to repeat vaginal swab. Appt made for nurse visit tomorrow at 1530.

## 2021-04-07 ENCOUNTER — Ambulatory Visit: Payer: Medicaid Other

## 2022-08-13 ENCOUNTER — Encounter (HOSPITAL_COMMUNITY): Payer: Self-pay

## 2022-08-13 ENCOUNTER — Emergency Department (HOSPITAL_COMMUNITY)
Admission: EM | Admit: 2022-08-13 | Discharge: 2022-08-13 | Disposition: A | Discharge status: Home / Self Care | Payer: Medicaid Other | Attending: Emergency Medicine | Admitting: Emergency Medicine

## 2022-08-13 ENCOUNTER — Other Ambulatory Visit: Payer: Self-pay

## 2022-08-13 DIAGNOSIS — I10 Essential (primary) hypertension: Secondary | ICD-10-CM | POA: Insufficient documentation

## 2022-08-13 DIAGNOSIS — Z20822 Contact with and (suspected) exposure to covid-19: Secondary | ICD-10-CM | POA: Insufficient documentation

## 2022-08-13 DIAGNOSIS — J020 Streptococcal pharyngitis: Secondary | ICD-10-CM | POA: Insufficient documentation

## 2022-08-13 LAB — GROUP A STREP BY PCR: Group A Strep by PCR: DETECTED — AB

## 2022-08-13 LAB — RESP PANEL BY RT-PCR (FLU A&B, COVID) ARPGX2
Influenza A by PCR: NEGATIVE
Influenza B by PCR: NEGATIVE
SARS Coronavirus 2 by RT PCR: NEGATIVE

## 2022-08-13 MED ORDER — PENICILLIN G BENZATHINE 1200000 UNIT/2ML IM SUSY
1.2000 10*6.[IU] | PREFILLED_SYRINGE | Freq: Once | INTRAMUSCULAR | Status: AC
  Administered 2022-08-13: 1.2 10*6.[IU] via INTRAMUSCULAR
  Filled 2022-08-13: qty 2

## 2022-08-13 MED ORDER — IBUPROFEN 400 MG PO TABS
600.0000 mg | ORAL_TABLET | Freq: Once | ORAL | Status: AC
  Administered 2022-08-13: 600 mg via ORAL
  Filled 2022-08-13: qty 1

## 2022-08-13 MED ORDER — DEXAMETHASONE 4 MG PO TABS
10.0000 mg | ORAL_TABLET | Freq: Once | ORAL | Status: AC
  Administered 2022-08-13: 10 mg via ORAL
  Filled 2022-08-13: qty 3

## 2022-08-13 NOTE — ED Provider Notes (Signed)
[facility] EMERGENCY DEPARTMENT Provider Note  CSN: [id number] Arrival date & time: 08/13/22 3762  Chief Complaint(s) Sore Throat  HPI Connie Shea is a 26 y.o. female with history of hypertension presenting to the emergency department with sore throat.  She reports sore throat for 2 days.  Reports associated painful swallowing though is able to tolerate solids and liquids.  No fevers, chills, cough, runny nose, chest pain, difficulty breathing.  Symptoms moderate.  No sick contacts.   Past Medical History Past Medical History:  Diagnosis Date   Acute pyelonephritis 07/19/2016   Anemia    Anxiety    Depression    Genital herpes    History of acute PID    Hypertension    Kidney infection    Obesity    Pelvic pain 02/13/2017   Positive urine pregnancy test 08/17/2017   Pre-eclampsia    Sexually transmitted disease    Sickle cell trait ([place])    Stutter    Patient Active Problem List   Diagnosis Date Noted   Constipation 02/09/2021   Abdominal pain, epigastric 02/09/2021   Loss of weight 02/09/2021   Hemorrhoids, internal 02/09/2021   Implanon insertion 01/04/2021   Herpes infection 01/02/2020   Chronic hypertension during pregnancy, antepartum 01/01/2020   Poor appetite 08/17/2017   Hypertension goal BP (blood pressure) < 130/80 06/23/2017   Moderate episode of recurrent major depressive disorder ([place]) 06/23/2017   Neck and shoulder pain 06/23/2017   Tachycardia 03/21/2017   Abnormal weight gain during pregnancy, antepartum 03/16/2017   Vitamin D deficiency 02/13/2017   Severe obesity (BMI >= 40) (HCC) 02/10/2017   Episodic tension-type headache, not intractable 02/10/2017   Elevated LFTs 07/18/2016   History of normocytic normochromic anemia 07/18/2016   Home Medication(s) Prior to Admission medications   Medication Sig Start Date End Date Taking? Authorizing Provider  hydrocortisone (ANUSOL-HC) 2.5 % rectal cream Place 1 application rectally 2  (two) times daily.    [provider]  iron polysaccharides (NIFEREX) 150 MG capsule Take 150 mg by mouth daily.    [provider]  linaclotide Rolan Lipa) 145 MCG CAPS capsule Take 1 capsule (145 mcg total) by mouth daily before breakfast. 02/23/21   Armbruster, Carlota Raspberry, MD  pantoprazole (PROTONIX) 40 MG tablet Take 40 mg by mouth 2 (two) times daily.    [provider]                                                                                                                                    Past Surgical History Past Surgical History:  Procedure Laterality Date   CESAREAN SECTION     CESAREAN SECTION     Family History Family History  Problem Relation Age of Onset   Hypertension Mother    Asthma Mother    Hypertension Maternal Aunt    Colon cancer Neg Hx    Esophageal cancer Neg Hx  Rectal cancer Neg Hx    Stomach cancer Neg Hx     Social History Social History   Tobacco Use   Smoking status: Never   Smokeless tobacco: Never  Vaping Use   Vaping Use: Never used  Substance Use Topics   Alcohol use: Not Currently    Comment: 1 day a week, occ   Drug use: Yes    Types: Marijuana    Comment: last smoked on 02/22/21   Allergies Phentermine hcl  Review of Systems Review of Systems  All other systems reviewed and are negative.   Physical Exam Vital Signs  I have reviewed the triage vital signs BP (!) 134/97   Pulse 94   Temp 98.3 F (36.8 C) (Oral)   Resp 14   Ht 5\' 4"  (1.626 m)   Wt 83.9 kg   SpO2 98%   BMI 31.76 kg/m  Physical Exam Vitals and nursing note reviewed.  Constitutional:      General: She is not in acute distress.    Appearance: She is well-developed.  HENT:     Head: Normocephalic and atraumatic.     Mouth/Throat:     Mouth: Mucous membranes are moist.     Dentition: Normal dentition. No dental tenderness or dental abscesses.     Tongue: Tongue does not deviate from midline.     Pharynx: Posterior  oropharyngeal erythema present. No uvula swelling.     Tonsils: Tonsillar exudate present. No tonsillar abscesses. 2+ on the right. 2+ on the left.  Eyes:     Pupils: Pupils are equal, round, and reactive to light.  Cardiovascular:     Rate and Rhythm: Normal rate and regular rhythm.     Heart sounds: No murmur heard. Pulmonary:     Effort: Pulmonary effort is normal. No respiratory distress.     Breath sounds: Normal breath sounds.  Abdominal:     General: Abdomen is flat.     Palpations: Abdomen is soft.     Tenderness: There is no abdominal tenderness.  Musculoskeletal:        General: No tenderness.     Right lower leg: No edema.     Left lower leg: No edema.  Skin:    General: Skin is warm and dry.  Neurological:     General: No focal deficit present.     Mental Status: She is alert. Mental status is at baseline.  Psychiatric:        Mood and Affect: Mood normal.        Behavior: Behavior normal.     ED Results and Treatments Labs (all labs ordered are listed, but only abnormal results are displayed) Labs Reviewed  GROUP A STREP BY PCR - Abnormal; Notable for the following components:      Result Value   Group A Strep by PCR DETECTED (*)    All other components within normal limits  RESP PANEL BY RT-PCR (FLU A&B, COVID) ARPGX2  Radiology No results found.  Pertinent labs & imaging results that were available during my care of the patient were reviewed by me and considered in my medical decision making (see MDM for details).  Medications Ordered in ED Medications  dexamethasone (DECADRON) tablet 10 mg (10 mg Oral Given 08/13/22 1348)  ibuprofen (ADVIL) tablet 600 mg (600 mg Oral Given 08/13/22 1348)  penicillin g benzathine (BICILLIN LA) 1200000 UNIT/2ML injection 1.2 Million Units (1.2 Million Units Intramuscular Given 08/13/22 1347)                                                                                                                                      Procedures Procedures  (including critical care time)  Medical Decision Making / ED Course   MDM:  26 year old female with a history of hypertension presenting with sore throat.  Examination with bilateral tonsillar enlargement and exudate without evidence of peritonsillar abscess, uvular deviation.  Voice is normal and patient tolerating p.o.  Rapid strep PCR is positive.  Symptoms are consistent with strep throat.  Will treat with penicillin, will give dexamethasone and ibuprofen for symptomatic relief.  Differential also includes, but I have extremely low suspicion for, retropharyngeal abscess, Ludewig's angina, dental infection, examination is reassuring without signs of these conditions.      Additional history obtained: -Additional history obtained from spouse   Lab Tests: -I ordered, reviewed, and interpreted labs.   The pertinent results include:   Labs Reviewed  GROUP A STREP BY PCR - Abnormal; Notable for the following components:      Result Value   Group A Strep by PCR DETECTED (*)    All other components within normal limits  RESP PANEL BY RT-PCR (FLU A&B, COVID) ARPGX2    Notable for positive strep PCR   Medicines ordered and prescription drug management: Meds ordered this encounter  Medications   dexamethasone (DECADRON) tablet 10 mg   ibuprofen (ADVIL) tablet 600 mg   penicillin g benzathine (BICILLIN LA) 1200000 UNIT/2ML injection 1.2 Million Units    Order Specific Question:   Antibiotic Indication:    Answer:   Pharyngitis    -I have reviewed the patients home medicines and have made adjustments as needed    Reevaluation: After the interventions noted above, I reevaluated the patient and found that they have improved  Co morbidities that complicate the patient evaluation  Past Medical History:  Diagnosis Date   Acute  pyelonephritis 07/19/2016   Anemia    Anxiety    Depression    Genital herpes    History of acute PID    Hypertension    Kidney infection    Obesity    Pelvic pain 02/13/2017   Positive urine pregnancy test 08/17/2017   Pre-eclampsia    Sexually transmitted disease    Sickle cell trait (HCC)    Stutter       Dispostion: Disposition decision including  need for hospitalization was considered, and patient discharged from emergency department.    Final Clinical Impression(s) / ED Diagnoses Final diagnoses:  Strep pharyngitis     This chart was dictated using voice recognition software.  Despite best efforts to proofread,  errors can occur which can change the documentation meaning.    Lonell Grandchild, MD 08/13/22 1353

## 2022-08-13 NOTE — Discharge Instructions (Signed)
Please take Tylenol and Motrin for your symptoms at home.  You can take 650 mg of Tylenol every 6 hours and 600 mg of ibuprofen every 6 hours as needed for your symptoms.  You can take these medicines together as needed, either at the same time, or alternating every 3 hours.  

## 2022-08-13 NOTE — ED Triage Notes (Signed)
Pt arrived POV from home c/o a sore throat x2 days and states it looks swollen in the back.

## 2022-08-13 NOTE — ED Provider Triage Note (Signed)
Emergency Medicine Provider Triage Evaluation Note  Connie Shea , a 26 y.o. female  was evaluated in triage.  Pt complains of sore throat x 2 days, reports significant difficulty swallowing, denies fever, chills, reports neck / throat feel excessively swollen. No previous hx of recurrent tonsillitis, strep.  Review of Systems  Positive: Sore throat, difficulty swallowing Negative: Fever, chills  Physical Exam  BP (!) 140/70   Pulse (!) 101   Temp 98.3 F (36.8 C) (Oral)   Resp (!) 22   Ht 5\' 4"  (1.626 m)   Wt 83.9 kg   SpO2 98%   BMI 31.76 kg/m  Gen:   Awake, no distress   Resp:  Normal effort  MSK:   Moves extremities without difficulty  Other:  Tonsils 2-3+ with crowding in posterior oropharynx, no white exudate, no uvula deviation, tonsils are swollen bilaterally  Medical Decision Making  Medically screening exam initiated at 9:28 AM.  Appropriate orders placed.  Josilynn Gurka was informed that the remainder of the evaluation will be completed by another provider, this initial triage assessment does not replace that evaluation, and the importance of remaining in the ED until their evaluation is complete.  Workup initiated   Anselmo Pickler, PA-C 08/13/22 0930

## 2023-02-12 ENCOUNTER — Other Ambulatory Visit: Payer: Self-pay

## 2023-02-12 ENCOUNTER — Emergency Department (HOSPITAL_COMMUNITY): Payer: BC Managed Care – PPO

## 2023-02-12 ENCOUNTER — Encounter (HOSPITAL_COMMUNITY): Payer: Self-pay | Admitting: *Deleted

## 2023-02-12 ENCOUNTER — Emergency Department (HOSPITAL_COMMUNITY)
Admission: EM | Admit: 2023-02-12 | Discharge: 2023-02-12 | Disposition: A | Discharge status: Home / Self Care | Payer: BC Managed Care – PPO | Attending: Emergency Medicine | Admitting: Emergency Medicine

## 2023-02-12 DIAGNOSIS — S99911A Unspecified injury of right ankle, initial encounter: Secondary | ICD-10-CM | POA: Diagnosis not present

## 2023-02-12 DIAGNOSIS — M25571 Pain in right ankle and joints of right foot: Secondary | ICD-10-CM | POA: Diagnosis present

## 2023-02-12 DIAGNOSIS — X501XXA Overexertion from prolonged static or awkward postures, initial encounter: Secondary | ICD-10-CM | POA: Diagnosis not present

## 2023-02-12 DIAGNOSIS — S82831A Other fracture of upper and lower end of right fibula, initial encounter for closed fracture: Secondary | ICD-10-CM | POA: Diagnosis not present

## 2023-02-12 MED ORDER — OXYCODONE-ACETAMINOPHEN 5-325 MG PO TABS
1.0000 | ORAL_TABLET | Freq: Once | ORAL | Status: AC
  Administered 2023-02-12: 1 via ORAL
  Filled 2023-02-12: qty 1

## 2023-02-12 MED ORDER — OXYCODONE-ACETAMINOPHEN 5-325 MG PO TABS: 1.0000 | ORAL_TABLET | ORAL | 0 refills | Status: AC | PRN

## 2023-02-12 NOTE — ED Triage Notes (Signed)
The pt fell down her stairs tonight  she has medial and lateral swelling with difficulty walking  lmp birth control

## 2023-02-12 NOTE — ED Provider Notes (Signed)
[facility] Emergency Department Provider Note MRN:  [id number]  Arrival date & time: 02/12/23     Chief Complaint   Ankle Pain   History of Present Illness   Connie Shea is a 27 y.o. year-old female presents to the ED with chief complaint of right ankle pain.  She states that she slipped or tripped on the last step of her stairs.  She rolled her ankle.  She states she has been unable to put pressure on it since.  She reports swelling to the ankle.  It is worsened with palpation and movement.  History provided by patient.   Review of Systems  Pertinent positive and negative review of systems noted in HPI.    Physical Exam   Vitals:   02/12/23 0059  BP: 131/87  Pulse: 84  Resp: 16  Temp: 98.9 F (37.2 C)  SpO2: 98%    CONSTITUTIONAL:  non toxic-appearing, NAD NEURO:  Alert and oriented x 3, CN 3-12 grossly intact EYES:  eyes equal and reactive ENT/NECK:  Supple, no stridor  CARDIO:  normal rate, appears well-perfused, intact distal pulses, brisk cap refill PULM:  No respiratory distress, CTAB GI/GU:  non-distended,  MSK/SPINE: Moderate swelling of the right ankle, tenderness to palpation laterally, range of motion limited by pain, strength deferred SKIN:  no rash, atraumatic   *Additional and/or pertinent findings included in MDM below  Diagnostic and Interventional Summary    EKG Interpretation  Date/Time:    Ventricular Rate:    PR Interval:    QRS Duration:   QT Interval:    QTC Calculation:   R Axis:     Text Interpretation:         Labs Reviewed - No data to display  DG Ankle Complete Right  Final Result      Medications  oxyCODONE-acetaminophen (PERCOCET/ROXICET) 5-325 MG per tablet 1 tablet (1 tablet Oral Given 02/12/23 0112)     Procedures  /  Critical Care Procedures  ED Course and Medical Decision Making  I have reviewed the triage vital signs, the nursing notes, and pertinent available records from the  EMR.  Social Determinants Affecting Complexity of Care: Patient has no clinically significant social determinants affecting this chief complaint..   ED Course:    Medical Decision Making Patient with right ankle injury.  Plain films notable for fracture.  Will place patient in splint and give crutches.  She will need to follow-up with orthopedics.  She has intact circulation and sensation.  Amount and/or Complexity of Data Reviewed Radiology: independent interpretation performed.    Details: Distal fibular fracture     Consultants: No consultations were needed in caring for this patient.   Treatment and Plan: Emergency department workup does not suggest an emergent condition requiring admission or immediate intervention beyond  what has been performed at this time. The patient is safe for discharge and has  been instructed to return immediately for worsening symptoms, change in  symptoms or any other concerns    Final Clinical Impressions(s) / ED Diagnoses     ICD-10-CM   1. Other closed fracture of distal end of right fibula, initial encounter  S82.831A     2. Injury of right ankle, initial encounter  S99.911A       ED Discharge Orders          Ordered    oxyCODONE-acetaminophen (PERCOCET) 5-325 MG tablet  Every 4 hours PRN        02/12/23 0206  Discharge Instructions Discussed with and Provided to Patient:     Discharge Instructions      You your right fibula.  You will not be able to drive until you are cleared by orthopedics.  Please use the crutches and wear the splint.  Please call and make an appointment with orthopedic.       Roxy Horseman, PA-C 02/12/23 0210    Sloan Leiter, DO 02/12/23 [phone]

## 2023-02-12 NOTE — Discharge Instructions (Addendum)
You your right fibula.  You will not be able to drive until you are cleared by orthopedics.  Please use the crutches and wear the splint.  Please call and make an appointment with orthopedic.

## 2023-06-10 ENCOUNTER — Ambulatory Visit
Admission: EM | Admit: 2023-06-10 | Discharge: 2023-06-10 | Disposition: A | Discharge status: Home / Self Care | Payer: BC Managed Care – PPO | Attending: Physician Assistant | Admitting: Physician Assistant

## 2023-06-10 DIAGNOSIS — J019 Acute sinusitis, unspecified: Secondary | ICD-10-CM | POA: Diagnosis present

## 2023-06-10 DIAGNOSIS — Z1152 Encounter for screening for COVID-19: Secondary | ICD-10-CM | POA: Diagnosis not present

## 2023-06-10 LAB — POCT INFLUENZA A/B
Influenza A, POC: NEGATIVE
Influenza B, POC: NEGATIVE

## 2023-06-10 MED ORDER — AMOXICILLIN-POT CLAVULANATE 875-125 MG PO TABS: 1.0000 | ORAL_TABLET | Freq: Two times a day (BID) | ORAL | 0 refills | Status: AC

## 2023-06-10 NOTE — ED Triage Notes (Signed)
"  I think I have the Flu". Started with "stuffy nose on Sunday and then Cough and now body aches". "Symptoms almost a week".

## 2023-06-10 NOTE — ED Provider Notes (Signed)
[facility]    CSN: [id number] Arrival date & time: 06/10/23  6010      History   Chief Complaint Chief Complaint  Patient presents with   Cough   Generalized Body Aches    HPI Connie Shea is a 27 y.o. female.   Patient here today for evaluation of nasal congestion that started 6 days ago.  She states that she has had sinus pressure develop since that time.  She has tried multiple over-the-counter medications without resolution.  She notes that she has had some cough and bodyaches.  She denies any vomiting or diarrhea.  She denies any ear pain.  The history is provided by the patient.  Cough Associated symptoms: myalgias   Associated symptoms: no chills, no ear pain, no eye discharge, no fever, no shortness of breath, no sore throat and no wheezing     Past Medical History:  Diagnosis Date   Acute pyelonephritis 07/19/2016   Anemia    Anxiety    Depression    Genital herpes    History of acute PID    Hypertension    Kidney infection    Obesity    Pelvic pain 02/13/2017   Positive urine pregnancy test 08/17/2017   Pre-eclampsia    Sexually transmitted disease    Sickle cell trait (HCC)    Stutter     Patient Active Problem List   Diagnosis Date Noted   Constipation 02/09/2021   Abdominal pain, epigastric 02/09/2021   Loss of weight 02/09/2021   Hemorrhoids, internal 02/09/2021   Implanon insertion 01/04/2021   Herpes infection 01/02/2020   Chronic hypertension during pregnancy, antepartum 01/01/2020   Poor appetite 08/17/2017   Hypertension goal BP (blood pressure) < 130/80 06/23/2017   Moderate episode of recurrent major depressive disorder (HCC) 06/23/2017   Neck and shoulder pain 06/23/2017   Tachycardia 03/21/2017   Abnormal weight gain during pregnancy, antepartum 03/16/2017   Vitamin D deficiency 02/13/2017   Severe obesity (BMI >= 40) (HCC) 02/10/2017   Episodic tension-type headache, not intractable 02/10/2017   Elevated LFTs  07/18/2016   History of normocytic normochromic anemia 07/18/2016    Past Surgical History:  Procedure Laterality Date   CESAREAN SECTION     CESAREAN SECTION     OTHER SURGICAL HISTORY Right    Ankle.    OB History     Gravida  4   Para  1   Term  0   Preterm  1   AB  3   Living  1      SAB  1   IAB  2   Ectopic  0   Multiple  0   Live Births  1            Home Medications    Prior to Admission medications   Medication Sig Start Date End Date Taking? Authorizing Provider  amoxicillin-clavulanate (AUGMENTIN) 875-125 MG tablet Take 1 tablet by mouth every 12 (twelve) hours. 06/10/23  Yes Tomi Bamberger, PA-C  etonogestrel (NEXPLANON) 68 MG IMPL implant 1 each by Subdermal route once.   Yes [provider]  hydrocortisone (ANUSOL-HC) 2.5 % rectal cream Place 1 application rectally 2 (two) times daily.    [provider]  iron polysaccharides (NIFEREX) 150 MG capsule Take 150 mg by mouth daily.    [provider]  linaclotide Karlene Einstein) 145 MCG CAPS capsule Take 1 capsule (145 mcg total) by mouth daily before breakfast. 02/23/21   Adela Lank, Viviann Spare  P, MD  oxyCODONE-acetaminophen (PERCOCET) 5-325 MG tablet Take 1-2 tablets by mouth every 4 (four) hours as needed. 02/12/23   Roxy Horseman, PA-C  pantoprazole (PROTONIX) 40 MG tablet Take 40 mg by mouth 2 (two) times daily.    [provider]    Family History Family History  Problem Relation Age of Onset   Hypertension Mother    Asthma Mother    Hypertension Maternal Aunt    Colon cancer Neg Hx    Esophageal cancer Neg Hx    Rectal cancer Neg Hx    Stomach cancer Neg Hx     Social History Social History   Tobacco Use   Smoking status: Never   Smokeless tobacco: Never  Vaping Use   Vaping status: Never Used  Substance Use Topics   Alcohol use: Not Currently    Comment: 1 day a week, occ   Drug use: Not Currently    Types: Marijuana    Comment: last smoked  on 02/22/21     Allergies   Phentermine hcl   Review of Systems Review of Systems  Constitutional:  Negative for chills and fever.  HENT:  Positive for congestion and sinus pressure. Negative for ear pain and sore throat.   Eyes:  Negative for discharge and redness.  Respiratory:  Positive for cough. Negative for shortness of breath and wheezing.   Gastrointestinal:  Negative for abdominal pain, diarrhea, nausea and vomiting.  Musculoskeletal:  Positive for myalgias.     Physical Exam Triage Vital Signs ED Triage Vitals  Encounter Vitals Group     BP 06/10/23 1020 (!) 140/80     Systolic BP Percentile --      Diastolic BP Percentile --      Pulse Rate 06/10/23 1020 86     Resp 06/10/23 1020 18     Temp 06/10/23 1020 98.4 F (36.9 C)     Temp Source 06/10/23 1020 Oral     SpO2 06/10/23 1020 96 %     Weight 06/10/23 1017 220 lb (99.8 kg)     Height 06/10/23 1017 5\' 5"  (1.651 m)     Head Circumference --      Peak Flow --      Pain Score 06/10/23 1015 0     Pain Loc --      Pain Education --      Exclude from Growth Chart --    No data found.  Updated Vital Signs BP (!) 140/80 (BP Location: Left Arm)   Pulse 86   Temp 98.4 F (36.9 C) (Oral)   Resp 18   Ht 5\' 5"  (1.651 m)   Wt 220 lb (99.8 kg)   LMP  (LMP Unknown)   SpO2 96%   BMI 36.61 kg/m       Physical Exam Vitals and nursing note reviewed.  Constitutional:      General: She is not in acute distress.    Appearance: Normal appearance. She is not ill-appearing.  HENT:     Head: Normocephalic and atraumatic.     Nose: Congestion present.     Mouth/Throat:     Mouth: Mucous membranes are moist.     Pharynx: Posterior oropharyngeal erythema present. No oropharyngeal exudate.  Eyes:     Conjunctiva/sclera: Conjunctivae normal.  Cardiovascular:     Rate and Rhythm: Normal rate and regular rhythm.     Heart sounds: Normal heart sounds. No murmur heard. Pulmonary:     Effort: Pulmonary effort is  normal. No respiratory distress.     Breath sounds: Normal breath sounds. No wheezing, rhonchi or rales.  Skin:    General: Skin is warm and dry.  Neurological:     Mental Status: She is alert.  Psychiatric:        Mood and Affect: Mood normal.        Thought Content: Thought content normal.      UC Treatments / Results  Labs (all labs ordered are listed, but only abnormal results are displayed) Labs Reviewed  SARS CORONAVIRUS 2 (TAT 6-24 HRS)  POCT INFLUENZA A/B    EKG   Radiology No results found.  Procedures Procedures (including critical care time)  Medications Ordered in UC Medications - No data to display  Initial Impression / Assessment and Plan / UC Course  I have reviewed the triage vital signs and the nursing notes.  Pertinent labs & imaging results that were available during my care of the patient were reviewed by me and considered in my medical decision making (see chart for details).    Flu and COVID screening ordered at patient's request.  Will treat with Augmentin to cover sinusitis given duration of symptoms and recommended follow-up if no gradual improvement with any worsening symptoms.  Patient expresses understanding.  Final Clinical Impressions(s) / UC Diagnoses   Final diagnoses:  Acute sinusitis, recurrence not specified, unspecified location   Discharge Instructions   None    ED Prescriptions     Medication Sig Dispense Auth. Provider   amoxicillin-clavulanate (AUGMENTIN) 875-125 MG tablet Take 1 tablet by mouth every 12 (twelve) hours. 14 tablet Tomi Bamberger, PA-C      PDMP not reviewed this encounter.   Tomi Bamberger, PA-C 06/10/23 1058

## 2023-06-11 ENCOUNTER — Emergency Department (HOSPITAL_COMMUNITY)
Admission: EM | Admit: 2023-06-11 | Discharge: 2023-06-11 | Disposition: A | Discharge status: Home / Self Care | Payer: BC Managed Care – PPO | Attending: Emergency Medicine | Admitting: Emergency Medicine

## 2023-06-11 ENCOUNTER — Other Ambulatory Visit: Payer: Self-pay

## 2023-06-11 ENCOUNTER — Encounter (HOSPITAL_COMMUNITY): Payer: Self-pay

## 2023-06-11 DIAGNOSIS — R0981 Nasal congestion: Secondary | ICD-10-CM | POA: Diagnosis present

## 2023-06-11 DIAGNOSIS — J0100 Acute maxillary sinusitis, unspecified: Secondary | ICD-10-CM | POA: Diagnosis not present

## 2023-06-11 DIAGNOSIS — I10 Essential (primary) hypertension: Secondary | ICD-10-CM | POA: Diagnosis not present

## 2023-06-11 LAB — SARS CORONAVIRUS 2 (TAT 6-24 HRS): SARS Coronavirus 2: NEGATIVE

## 2023-06-11 MED ORDER — FLUTICASONE PROPIONATE 50 MCG/ACT NA SUSP: 1.0000 | Freq: Every day | NASAL | 2 refills | Status: AC

## 2023-06-11 NOTE — ED Provider Notes (Signed)
[place] EM[facility] DEPARTMENT AT [facility] Provider Note   CSN: [id number] Arrival date & time: 06/11/23  1046     History {Add pertinent medical, surgical, social history, OB history to HPI:1} Chief Complaint  Patient presents with   Nasal Congestion    Connie Shea is a 27 y.o. female.  HPI  Past Medical History:  Diagnosis Date   Acute pyelonephritis 07/19/2016   Anemia    Anxiety    Depression    Genital herpes    History of acute PID    Hypertension    Kidney infection    Obesity    Pelvic pain 02/13/2017   Positive urine pregnancy test 08/17/2017   Pre-eclampsia    Sexually transmitted disease    Sickle cell trait (HCC)    Stutter    Past Surgical History:  Procedure Laterality Date   CESAREAN SECTION     CESAREAN SECTION     OTHER SURGICAL HISTORY Right    Ankle.     Home Medications Prior to Admission medications   Medication Sig Start Date End Date Taking? Authorizing Provider  fluticasone (FLONASE) 50 MCG/ACT nasal spray Place 1 spray into both nostrils daily. 06/11/23  Yes Jeanelle Malling, PA  amoxicillin-clavulanate (AUGMENTIN) 875-125 MG tablet Take 1 tablet by mouth every 12 (twelve) hours. 06/10/23   Tomi Bamberger, PA-C  etonogestrel (NEXPLANON) 68 MG IMPL implant 1 each by Subdermal route once.    [provider]  hydrocortisone (ANUSOL-HC) 2.5 % rectal cream Place 1 application rectally 2 (two) times daily.    [provider]  iron polysaccharides (NIFEREX) 150 MG capsule Take 150 mg by mouth daily.    [provider]  linaclotide Karlene Einstein) 145 MCG CAPS capsule Take 1 capsule (145 mcg total) by mouth daily before breakfast. 02/23/21   Armbruster, Willaim Rayas, MD  oxyCODONE-acetaminophen (PERCOCET) 5-325 MG tablet Take 1-2 tablets by mouth every 4 (four) hours as needed. 02/12/23   Roxy Horseman, PA-C  pantoprazole (PROTONIX) 40 MG tablet Take 40 mg by mouth 2 (two) times daily.    [provider]       Allergies    Phentermine hcl    Review of Systems   Review of Systems  Physical Exam Updated Vital Signs BP 134/85   Pulse 76   Temp 98.1 F (36.7 C) (Oral)   Resp 15   Ht 5\' 5"  (1.651 m)   Wt 99.8 kg   LMP  (LMP Unknown)   SpO2 97%   BMI 36.61 kg/m  Physical Exam  ED Results / Procedures / Treatments   Labs (all labs ordered are listed, but only abnormal results are displayed) Labs Reviewed - No data to display  EKG None  Radiology No results found.  Procedures Procedures  {Document cardiac monitor, telemetry assessment procedure when appropriate:1}  Medications Ordered in ED Medications - No data to display  ED Course/ Medical Decision Making/ A&P   {   Click here for ABCD2, HEART and other calculatorsREFRESH Note before signing :1}                              Medical Decision Making  This patient presents to the ED for sore throat, body aches, this involves an extensive number of treatment options, and is a complaint that carries with a high risk of complications and morbidity.  The differential diagnosis includes flu, COVID, RSV, strep, pharyngitis, bronchitis, pneumonia,  infectious etiology.  This is not an exhaustive list.  Lab tests: I ordered and personally interpreted labs.  The pertinent results include: Viral panel positive for flu.  Imaging studies:  Problem list/ ED course/ Critical interventions/ Medical management: HPI: See above Vital signs within normal range and stable throughout visit. Laboratory/imaging studies significant for: See above. On physical examination, patient is afebrile and appears in no acute distress. This patient presents with symptoms suspicious for flu. Based on history and physical doubt sinusitis. COVID test was negative. Do not suspect underlying cardiopulmonary process. I considered, but think unlikely, pneumonia. Patient is nontoxic appearing and not in need of emergent medical intervention. Patient told to self  isolate at home until symptoms subside for 72 hours. Recommended patient to take TheraFlu or Mucinex for symptom relief.  Follow-up with primary care physician for further evaluation and management.  Return to the ER if new or worsening symptoms. I have reviewed the patient home medicines and have made adjustments as needed.  Cardiac monitoring/EKG: The patient was maintained on a cardiac monitor.  I personally reviewed and interpreted the cardiac monitor which showed an underlying rhythm of: sinus rhythm.  Additional history obtained: External records from outside source obtained and reviewed including: Chart review including previous notes, labs, imaging.  Consultations obtained:  Disposition Continued outpatient therapy. Follow-up with PCP recommended for reevaluation of symptoms. Treatment plan discussed with patient.  Pt acknowledged understanding was agreeable to the plan. Worrisome signs and symptoms were discussed with patient, and patient acknowledged understanding to return to the ED if they noticed these signs and symptoms. Patient was stable upon discharge.   This chart was dictated using voice recognition software.  Despite best efforts to proofread,  errors can occur which can change the documentation meaning.    {Document critical care time when appropriate:1} {Document review of labs and clinical decision tools ie heart score, Chads2Vasc2 etc:1}  {Document your independent review of radiology images, and any outside records:1} {Document your discussion with family members, caretakers, and with consultants:1} {Document social determinants of health affecting pt's care:1} {Document your decision making why or why not admission, treatments were needed:1} Final Clinical Impression(s) / ED Diagnoses Final diagnoses:  None    Rx / DC Orders ED Discharge Orders          Ordered    fluticasone (FLONASE) 50 MCG/ACT nasal spray  Daily        06/11/23 1349

## 2023-06-11 NOTE — ED Notes (Signed)
 Discharge instructions reviewed with pt. Pt verbalized understanding. Pt a&ox4, in no acute distress.

## 2023-06-11 NOTE — Discharge Instructions (Addendum)
Please use Flonase nasal spray as prescribed. Please take your antibiotics as prescribed.  You can also try OTC DayQuil/NyQuil or TheraFlu for symptom relief.  Take tylenol/ibuprofen for pain. I recommend close follow-up with PCP for reevaluation.  Please do not hesitate to return to emergency department if worrisome signs symptoms we discussed become apparent.

## 2023-06-11 NOTE — ED Triage Notes (Signed)
Pt arrived POV from home c/o nasal congestion x1 week and has tried OTC meds with no relief. Pt was seen at urgent care yesterday and was tested for COVID and Flu and it was negative. Pt was given Amoxicillin yesterday and has had 2 doses with no relief.

## 2024-01-05 ENCOUNTER — Ambulatory Visit: Payer: BC Managed Care – PPO | Admitting: Family Medicine

## 2024-02-21 ENCOUNTER — Telehealth: Payer: Self-pay | Admitting: Family Medicine

## 2024-02-21 NOTE — Telephone Encounter (Signed)
 Patient says her IUD expired in march and she has been cramping for 2-3 days and wants to know if it is normal. Patient will be waiting on a call back.

## 2024-02-28 NOTE — Telephone Encounter (Signed)
 Per chart review has Nexplanon  in place and [facility] nexplanon  removal appointment in March and has rescheduled appointment for nexplanon  removal in June. I called Connie Shea and left a message I am returning her call and see that she has an upcoming appointment, may call back or send us  a MyChart message if you still have questions. Connie Shea

## 2024-03-22 ENCOUNTER — Ambulatory Visit: Admitting: Obstetrics and Gynecology
